# Patient Record
Sex: Female | Born: 1990 | Race: White | Hispanic: No | Marital: Married | Smoking: Never smoker
Health system: Southern US, Community
[De-identification: ages and names within clinical notes are randomized; demographics above are authoritative.]

## PROBLEM LIST (undated history)

## (undated) ENCOUNTER — Inpatient Hospital Stay (HOSPITAL_COMMUNITY): Payer: Self-pay

## (undated) ENCOUNTER — Inpatient Hospital Stay (HOSPITAL_COMMUNITY): Payer: PRIVATE HEALTH INSURANCE

## (undated) DIAGNOSIS — K219 Gastro-esophageal reflux disease without esophagitis: Secondary | ICD-10-CM

## (undated) DIAGNOSIS — D649 Anemia, unspecified: Secondary | ICD-10-CM

## (undated) DIAGNOSIS — G919 Hydrocephalus, unspecified: Secondary | ICD-10-CM

## (undated) DIAGNOSIS — O34219 Maternal care for unspecified type scar from previous cesarean delivery: Secondary | ICD-10-CM

## (undated) DIAGNOSIS — M419 Scoliosis, unspecified: Secondary | ICD-10-CM

## (undated) HISTORY — PX: COLONOSCOPY: SHX174

## (undated) HISTORY — PX: VENTRICULOPERITONEAL SHUNT: SHX204

---
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
## 2013-10-31 LAB — OB RESULTS CONSOLE RUBELLA ANTIBODY, IGM: Rubella: IMMUNE

## 2013-10-31 LAB — OB RESULTS CONSOLE HEPATITIS B SURFACE ANTIGEN: HEP B S AG: NEGATIVE

## 2013-10-31 LAB — OB RESULTS CONSOLE HIV ANTIBODY (ROUTINE TESTING): HIV: NONREACTIVE

## 2013-10-31 LAB — OB RESULTS CONSOLE ANTIBODY SCREEN: ANTIBODY SCREEN: NEGATIVE

## 2013-10-31 LAB — OB RESULTS CONSOLE RPR: RPR: NONREACTIVE

## 2013-10-31 LAB — OB RESULTS CONSOLE ABO/RH: RH Type: POSITIVE

## 2013-12-17 ENCOUNTER — Inpatient Hospital Stay (HOSPITAL_COMMUNITY)
Admission: AD | Admit: 2013-12-17 | Discharge: 2013-12-17 | Disposition: A | Discharge status: Home / Self Care | Payer: BC Managed Care – PPO | Source: Ambulatory Visit | Attending: Obstetrics & Gynecology | Admitting: Obstetrics & Gynecology

## 2013-12-17 ENCOUNTER — Inpatient Hospital Stay (HOSPITAL_COMMUNITY): Payer: BC Managed Care – PPO

## 2013-12-17 ENCOUNTER — Encounter (HOSPITAL_COMMUNITY): Payer: Self-pay

## 2013-12-17 DIAGNOSIS — O468X9 Other antepartum hemorrhage, unspecified trimester: Secondary | ICD-10-CM

## 2013-12-17 DIAGNOSIS — O418X90 Other specified disorders of amniotic fluid and membranes, unspecified trimester, not applicable or unspecified: Secondary | ICD-10-CM

## 2013-12-17 DIAGNOSIS — G911 Obstructive hydrocephalus: Secondary | ICD-10-CM | POA: Insufficient documentation

## 2013-12-17 DIAGNOSIS — O209 Hemorrhage in early pregnancy, unspecified: Secondary | ICD-10-CM | POA: Insufficient documentation

## 2013-12-17 DIAGNOSIS — K219 Gastro-esophageal reflux disease without esophagitis: Secondary | ICD-10-CM | POA: Insufficient documentation

## 2013-12-17 DIAGNOSIS — O43899 Other placental disorders, unspecified trimester: Principal | ICD-10-CM

## 2013-12-17 DIAGNOSIS — R109 Unspecified abdominal pain: Secondary | ICD-10-CM | POA: Insufficient documentation

## 2013-12-17 DIAGNOSIS — O368990 Maternal care for other specified fetal problems, unspecified trimester, not applicable or unspecified: Secondary | ICD-10-CM | POA: Insufficient documentation

## 2013-12-17 HISTORY — DX: Hydrocephalus, unspecified: G91.9

## 2013-12-17 HISTORY — DX: Scoliosis, unspecified: M41.9

## 2013-12-17 HISTORY — DX: Gastro-esophageal reflux disease without esophagitis: K21.9

## 2013-12-17 LAB — CBC
HCT: 34.8 % — ABNORMAL LOW (ref 36.0–46.0)
HEMOGLOBIN: 12.5 g/dL (ref 12.0–15.0)
MCH: 30 pg (ref 26.0–34.0)
MCHC: 35.9 g/dL (ref 30.0–36.0)
MCV: 83.5 fL (ref 78.0–100.0)
Platelets: 157 10*3/uL (ref 150–400)
RBC: 4.17 MIL/uL (ref 3.87–5.11)
RDW: 13.8 % (ref 11.5–15.5)
WBC: 7.2 10*3/uL (ref 4.0–10.5)

## 2013-12-17 NOTE — Discharge Instructions (Signed)
Subchorionic Hematoma °A subchorionic hematoma is a gathering of blood between the outer wall of the placenta and the inner wall of the womb (uterus). The placenta is the organ that connects the fetus to the wall of the uterus. The placenta performs the feeding, breathing (oxygen to the fetus), and waste removal (excretory work) of the fetus.  °Subchorionic hematoma is the most common abnormality found on a result from ultrasonography done during the first trimester or early second trimester of pregnancy. If there has been little or no vaginal bleeding, early small hematomas usually shrink on their own and do not affect your baby or pregnancy. The blood is gradually absorbed over 1 2 weeks. When bleeding starts later in pregnancy or the hematoma is larger or occurs in an older pregnant woman, the outcome may not be as good. Larger hematomas may get bigger, which increases the chances for miscarriage. Subchorionic hematoma also increases the risk of premature detachment of the placenta from the uterus, preterm (premature) labor, and stillbirth. °HOME CARE INSTRUCTIONS  °· Stay on bed rest if your health care provider recommends this. Although bed rest will not prevent more bleeding or prevent a miscarriage, your health care provider may recommend bed rest until you are advised otherwise. °· Avoid heavy lifting (more than 10 lb [4.5 kg]), exercise, sexual intercourse, or douching as directed by your health care provider. °· Keep track of the number of pads you use each day and how soaked (saturated) they are. Write down this information. °· Do not use tampons. °· Keep all follow-up appointments as directed by your health care provider. Your health care provider may ask you to have follow-up blood tests or ultrasound tests or both. °SEEK IMMEDIATE MEDICAL CARE IF:  °· You have severe cramps in your stomach, back, abdomen, or pelvis. °· You have a fever. °· You pass large clots or tissue. Save any tissue for your health  care provider to look at. °· Your bleeding increases or you become lightheaded, feel weak, or have fainting episodes. °Document Released: 01/28/2007 Document Revised: 08/03/2013 Document Reviewed: 05/12/2013 °ExitCare® Patient Information ©2014 [employer]. ° °Pelvic Rest °Pelvic rest is sometimes recommended for women when:  °· The placenta is partially or completely covering the opening of the cervix (placenta previa). °· There is bleeding between the uterine wall and the amniotic sac in the first trimester (subchorionic hemorrhage). °· The cervix begins to open without labor starting (incompetent cervix, cervical insufficiency). °· The labor is too early (preterm labor). °HOME CARE INSTRUCTIONS °· Do not have sexual intercourse, stimulation, or an orgasm. °· Do not use tampons, douche, or put anything in the vagina. °· Do not lift anything over 10 pounds (4.5 kg). °· Avoid strenuous activity or straining your pelvic muscles. °SEEK MEDICAL CARE IF:  °· You have any vaginal bleeding during pregnancy. Treat this as a potential emergency. °· You have cramping pain felt low in the stomach (stronger than menstrual cramps). °· You notice vaginal discharge (watery, mucus, or bloody). °· You have a low, dull backache. °· There are regular contractions or uterine tightening. °SEEK IMMEDIATE MEDICAL CARE IF: °You have vaginal bleeding and have placenta previa.  °Document Released: 02/07/2011 Document Revised: 01/05/2012 Document Reviewed: 02/07/2011 °ExitCare® Patient Information ©2014 [employer]. ° °

## 2013-12-17 NOTE — MAU Provider Note (Signed)
History     CSN: [id number]  Arrival date and time: 12/17/13 5366 Orders placed in EPIC: [phone] Provider at bedside: 0800     Chief Complaint  Patient presents with  . Vaginal Bleeding   HPI  [name] is a 23 yo  G1P0 female at 12.[redacted] wks gestation by ultrasound presenting with vaginal bleeding and  abdominal cramping. She has a known [facility] that measured 1 cm.  Past Medical History  Diagnosis Date  . Hydrocephalus with operating shunt     surgery 2001 and then revised 2002  . GERD (gastroesophageal reflux disease)   . Scoliosis     Past Surgical History  Procedure Laterality Date  . Ventriculoperitoneal shunt      Family History  Problem Relation Age of Onset  . Asthma Mother   . Hypertension Mother   . Hypertension Father     History  Substance Use Topics  . Smoking status: Never Smoker   . Smokeless tobacco: Never Used  . Alcohol Use: No    Allergies: No Known Allergies  Prescriptions prior to admission  Medication Sig Dispense Refill  . famotidine (PEPCID) 10 MG tablet Take 10 mg by mouth daily.      . Prenatal Vit-Fe Fumarate-FA (PRENATAL MULTIVITAMIN) TABS tablet Take 1 tablet by mouth daily at 12 noon.        Review of Systems  Constitutional: Negative.   HENT: Negative.   Eyes: Negative.   Respiratory: Negative.   Cardiovascular: Negative.   Gastrointestinal: Negative.   Genitourinary: Negative.   Musculoskeletal: Negative.   Skin: Negative.   Neurological: Negative.   Endo/Heme/Allergies: Negative.   Psychiatric/Behavioral: Negative.    Results for orders placed during the hospital encounter of 12/17/13 (from the past 24 hour(s))  CBC     Status: Abnormal   Collection Time    12/17/13  7:04 AM      Result Value Ref Range   WBC 7.2  4.0 - 10.5 K/uL   RBC 4.17  3.87 - 5.11 MIL/uL   Hemoglobin 12.5  12.0 - 15.0 g/dL   HCT 47.4 (*) 25.9 - 56.3 %   MCV 83.5  78.0 - 100.0 fL   MCH 30.0  26.0 - 34.0 pg   MCHC 35.9  30.0 - 36.0 g/dL    RDW 87.5  64.3 - 32.9 %   Platelets 157  150 - 400 K/uL   US Ob Comp Less 14 Wks  12/17/2013   CLINICAL DATA:  Vaginal bleeding, history of a known subchorionic hemorrhage  EXAM: OBSTETRIC <14 WK ULTRASOUND  TECHNIQUE: Transabdominal ultrasound was performed for evaluation of the gestation as well as the maternal uterus and adnexal regions.  COMPARISON:  None available  FINDINGS: Intrauterine gestational sac: Visualized/normal in shape.  Yolk sac:  Not visualized  Embryo:  Visualized  Cardiac Activity: Visualized  Heart Rate: 140 bpm  CRL:   6.04 cm 12 w 4 d                  [place] EDC: 06/27/2014  Maternal uterus/adnexae: Single viable intrauterine pregnancy. Estimated gestational age [redacted] weeks 4 days by crown-rump length measurement as above. Small anterior fundal subchorionic hemorrhage noted measures 3.2 x 1.0 x 4.0 cm. Ovaries appear normal. Small to moderate amount of pelvic free fluid within the cul-de-sac and adnexal regions.  IMPRESSION: Single viable intrauterine pregnancy, estimated gestational age [redacted] weeks 4 days.  Small anterior subchorionic hemorrhage  Small to moderate amount of pelvic free  fluid   Electronically Signed   By: Ruel Favorsrevor  Shick M.D.   On: 12/17/2013 07:56   Physical Exam   Blood pressure 116/66, pulse 69, temperature 97.9 F (36.6 C), temperature source Oral, resp. rate 18.  Physical Exam  Constitutional: She is oriented to person, place, and time. She appears well-developed and well-nourished.  HENT:  Head: Normocephalic and atraumatic.  Eyes: Pupils are equal, round, and reactive to light.  Neck: Normal range of motion. Neck supple.  Cardiovascular: Normal rate, regular rhythm and normal heart sounds.   Respiratory: Effort normal and breath sounds normal.  GI: Soft. Bowel sounds are normal.  Genitourinary: Vagina normal and uterus normal.  Gravid uterus, S=D / small amount of light brown blood in vaginal vault  Musculoskeletal: Normal range of motion.  Neurological:  She is alert and oriented to person, place, and time.  Skin: Skin is warm and dry.  Psychiatric: She has a normal mood and affect. Her behavior is normal. Judgment and thought content normal.    MAU Course  Procedures CBC OB Ultrasound <14wks  Assessment and Plan  IUP @ 12.[redacted] wks gestation Subchorionic Hematoma  Discharge Home Schedule ultrascreen and NT sono next week Pelvic Rest Bleeding Precautions Call the office, if symptoms worsen  Kenard GowerDAWSON, Kristina Aguilar, M, MSN, CNM 12/17/2013, 8:11 AM

## 2013-12-17 NOTE — MAU Note (Signed)
Woke up having vaginal bleeding at 6 am, dk red across panties but now having no bleeding. C/o "gas pains" in low abd when she woke up too.

## 2014-04-04 LAB — OB RESULTS CONSOLE RPR: RPR: NONREACTIVE

## 2014-05-26 IMAGING — US US OB COMP LESS 14 WK
1 series · 14 of 28 positions shown · non-contrast
Comparison: None available

CLINICAL DATA: Vaginal bleeding, history of a known subchorionic
hemorrhage

EXAM:
OBSTETRIC <14 WK ULTRASOUND
TECHNIQUE: Transabdominal ultrasound was performed for evaluation of the
gestation as well as the maternal uterus and adnexal regions.

[Series 1: us ob comp less 14 wks · 14 of 34 slices shown]
[im 2/34]
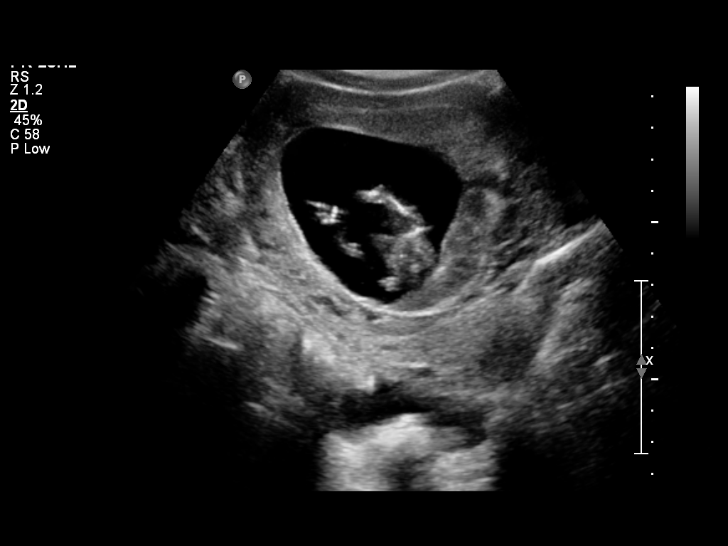
[im 4/34]
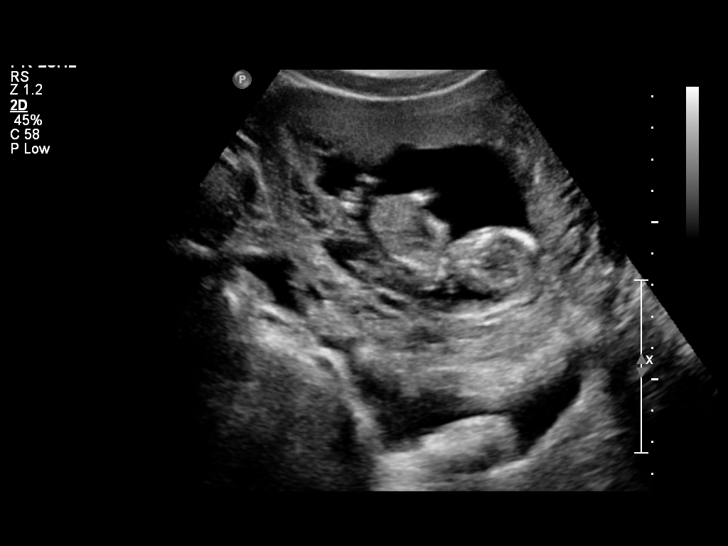
[im 7/34]
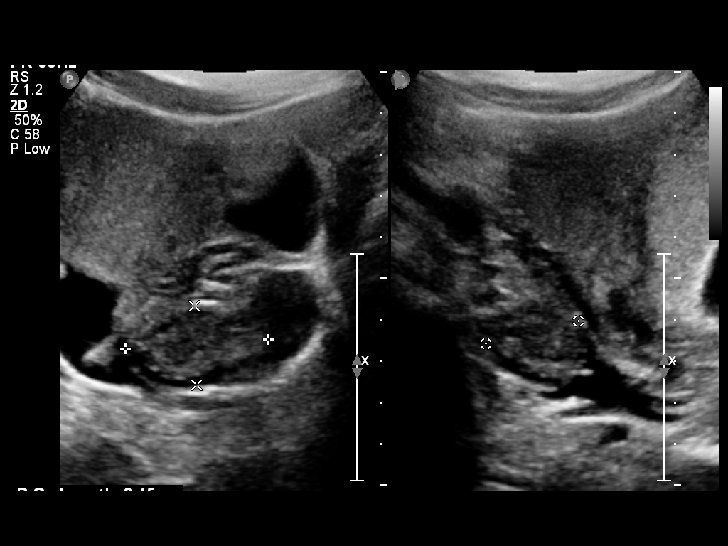
[im 9/34]
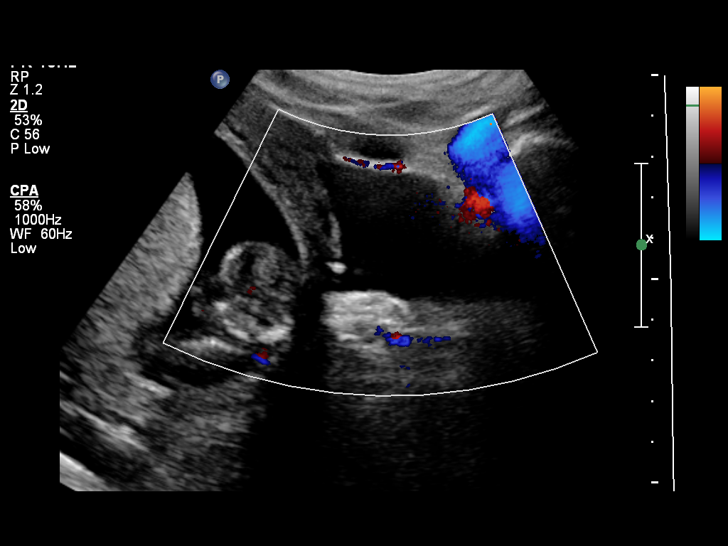
[im 12/34]
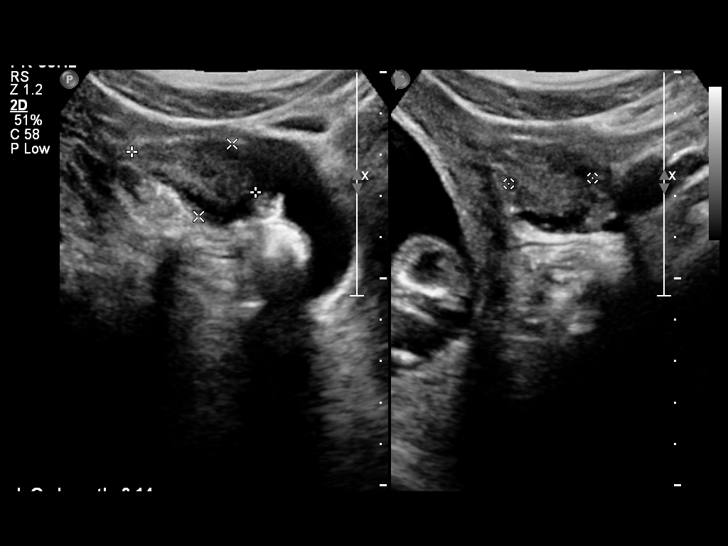
[im 14/34]
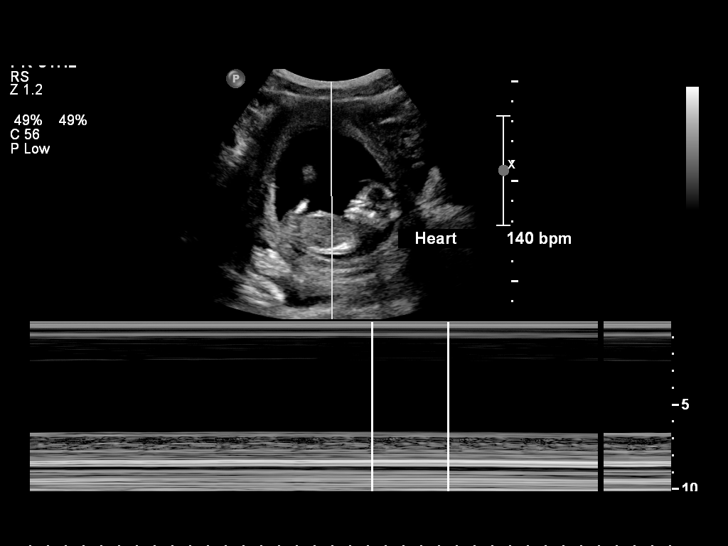
[im 16/34]
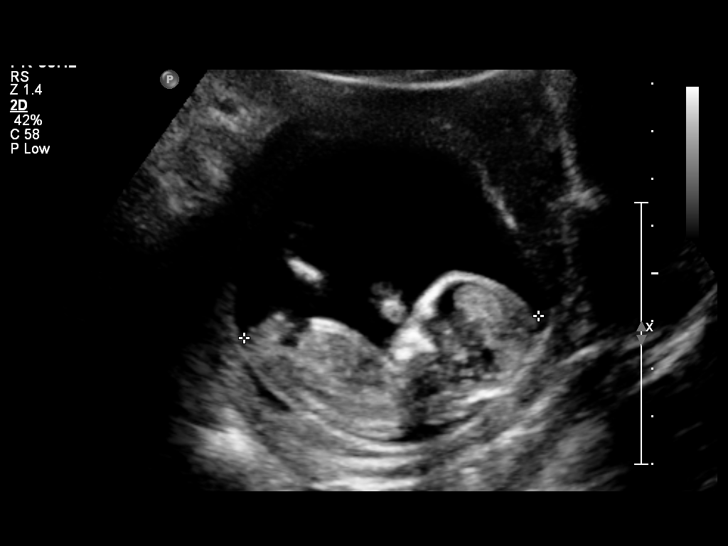
[im 19/34]
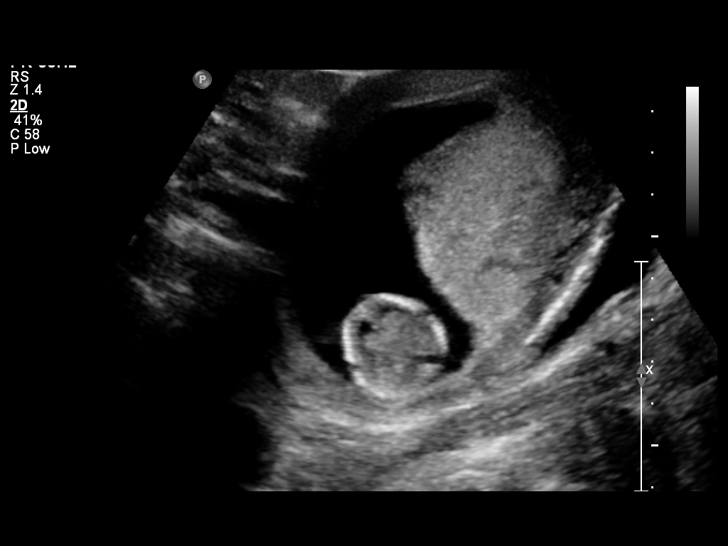
[im 21/34]
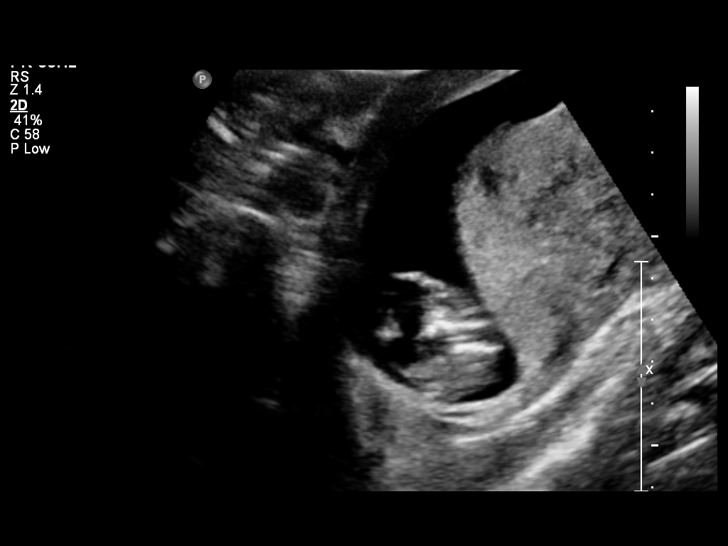
[im 24/34]
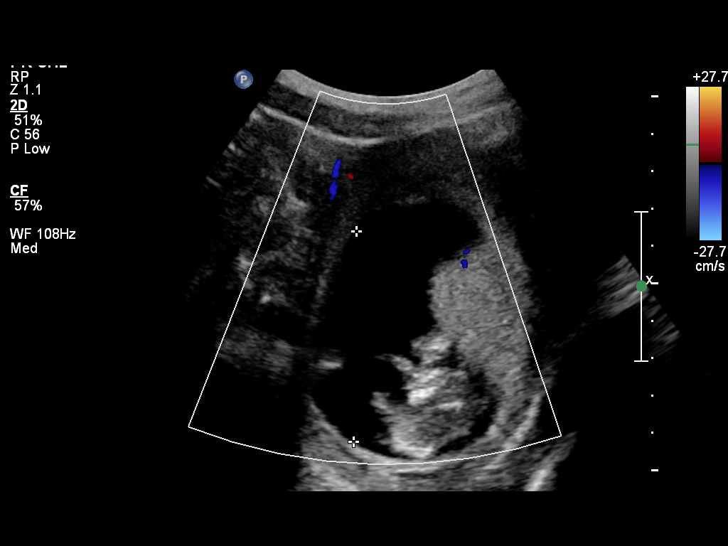
[im 26/34]
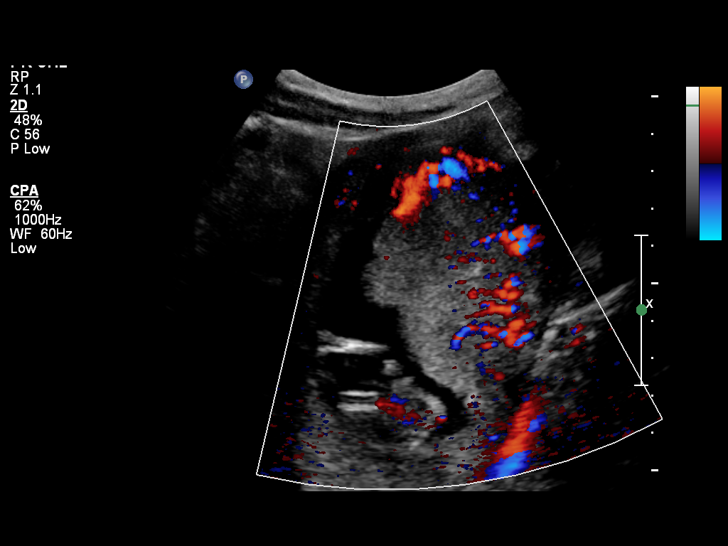
[im 29/34]
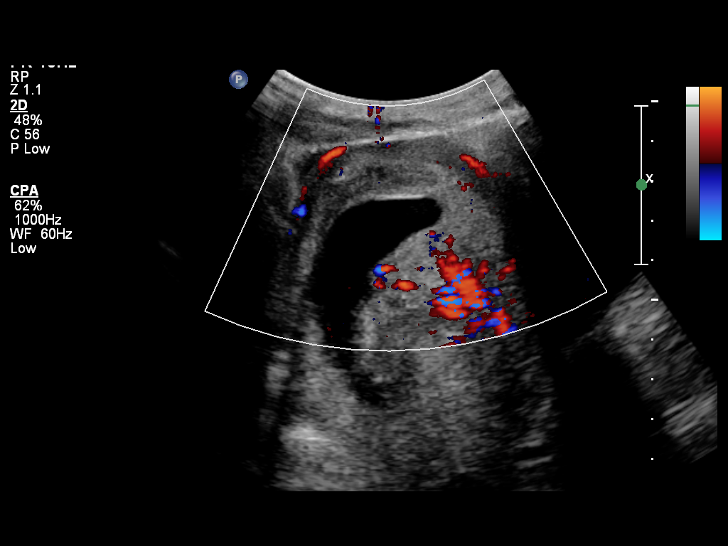
[im 31/34]
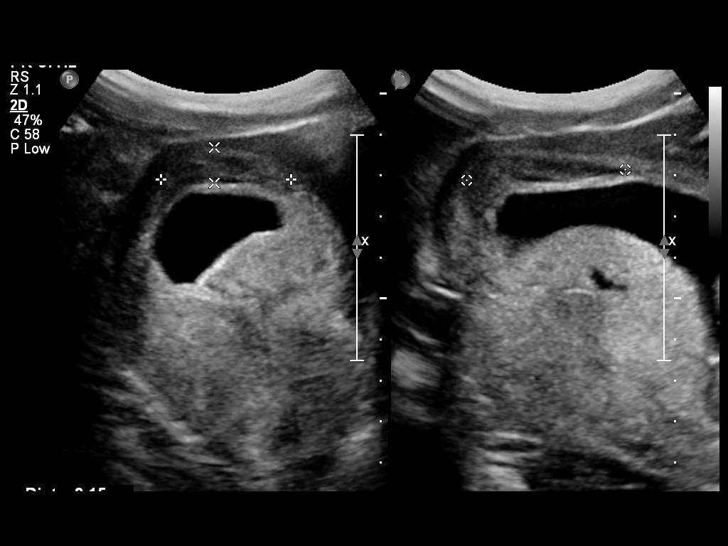
[im 34/34]
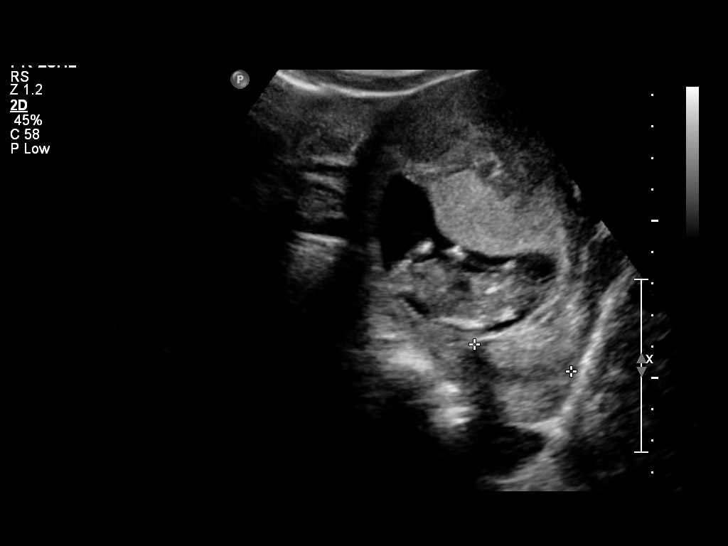

[14 of 28 positions shown; findings below may reference images not displayed]

FINDINGS: Intrauterine gestational sac: Visualized/normal in shape.

Yolk sac:  Not visualized

Embryo:  Visualized

Cardiac Activity: Visualized

Heart Rate: 140 bpm

CRL:   6.04 cm 12 w 4 d                  [place] EDC: 06/27/2014

Maternal uterus/adnexae: Single viable intrauterine pregnancy.
Estimated gestational age 12 weeks 4 days by crown-rump length
measurement as above. Small anterior fundal subchorionic hemorrhage
noted measures 3.2 x 1.0 x 4.0 cm. Ovaries appear normal. Small to
moderate amount of pelvic free fluid within the cul-de-sac and
adnexal regions.
IMPRESSION: Single viable intrauterine pregnancy, estimated gestational age 12
weeks 4 days.

Small anterior subchorionic hemorrhage

Small to moderate amount of pelvic free fluid

## 2014-06-19 ENCOUNTER — Other Ambulatory Visit: Payer: Self-pay | Admitting: Obstetrics & Gynecology

## 2014-06-20 ENCOUNTER — Encounter (HOSPITAL_COMMUNITY): Payer: Self-pay | Admitting: Pharmacist

## 2014-06-22 ENCOUNTER — Encounter (HOSPITAL_COMMUNITY)
Admission: RE | Admit: 2014-06-22 | Discharge: 2014-06-22 | Disposition: A | Discharge status: Home / Self Care | Payer: BC Managed Care – PPO | Source: Ambulatory Visit | Attending: Obstetrics & Gynecology | Admitting: Obstetrics & Gynecology

## 2014-06-22 ENCOUNTER — Encounter (HOSPITAL_COMMUNITY): Payer: Self-pay

## 2014-06-22 HISTORY — DX: Anemia, unspecified: D64.9

## 2014-06-22 LAB — CBC
HEMATOCRIT: 37.5 % (ref 36.0–46.0)
Hemoglobin: 13.2 g/dL (ref 12.0–15.0)
MCH: 32.4 pg (ref 26.0–34.0)
MCHC: 35.2 g/dL (ref 30.0–36.0)
MCV: 92.1 fL (ref 78.0–100.0)
Platelets: 162 10*3/uL (ref 150–400)
RBC: 4.07 MIL/uL (ref 3.87–5.11)
RDW: 13.9 % (ref 11.5–15.5)
WBC: 10.8 10*3/uL — ABNORMAL HIGH (ref 4.0–10.5)

## 2014-06-22 LAB — TYPE AND SCREEN
ABO/RH(D): O POS
ANTIBODY SCREEN: NEGATIVE

## 2014-06-22 LAB — ABO/RH: ABO/RH(D): O POS

## 2014-06-22 NOTE — Patient Instructions (Addendum)
   Your procedure is scheduled on:  Friday, August 28  Enter through the [employer] of [facility] at: 1215 PM Pick up the phone at the desk and dial [phone] and inform us of your arrival.  Please call this number if you have any problems the morning of surgery: [phone]  Remember: Do not eat food after midnight: Thursday Do not drink clear liquids after:  930 AM Friday, day of surgery Take these medicines the morning of surgery with a SIP OF WATER:  Do not wear jewelry, make-up, or FINGER nail polish No metal in your hair or on your body. Do not wear lotions, powders, perfumes.  You may wear deodorant.  Do not bring valuables to the hospital. Contacts, dentures or bridgework may not be worn into surgery.  Leave suitcase in the car. After Surgery it may be brought to your room. For patients being admitted to the hospital, checkout time is 11:00am the day of discharge.  Home with husband Trinna Post cell [phone].

## 2014-06-23 ENCOUNTER — Inpatient Hospital Stay (HOSPITAL_COMMUNITY): Payer: BC Managed Care – PPO | Admitting: Anesthesiology

## 2014-06-23 ENCOUNTER — Inpatient Hospital Stay (HOSPITAL_COMMUNITY)
Admission: AD | Admit: 2014-06-23 | Discharge: 2014-06-25 | DRG: 766 | Disposition: A | Discharge status: Home / Self Care | Payer: BC Managed Care – PPO | Source: Ambulatory Visit | Attending: Obstetrics & Gynecology | Admitting: Obstetrics & Gynecology

## 2014-06-23 ENCOUNTER — Encounter (HOSPITAL_COMMUNITY): Payer: BC Managed Care – PPO | Admitting: Anesthesiology

## 2014-06-23 ENCOUNTER — Encounter (HOSPITAL_COMMUNITY): Payer: Self-pay | Admitting: Certified Registered Nurse Anesthetist

## 2014-06-23 ENCOUNTER — Encounter (HOSPITAL_COMMUNITY)
Admission: AD | Disposition: A | Discharge status: Home / Self Care | Payer: Self-pay | Source: Ambulatory Visit | Attending: Obstetrics & Gynecology

## 2014-06-23 DIAGNOSIS — K219 Gastro-esophageal reflux disease without esophagitis: Secondary | ICD-10-CM | POA: Diagnosis present

## 2014-06-23 DIAGNOSIS — Z825 Family history of asthma and other chronic lower respiratory diseases: Secondary | ICD-10-CM

## 2014-06-23 DIAGNOSIS — Z8249 Family history of ischemic heart disease and other diseases of the circulatory system: Secondary | ICD-10-CM

## 2014-06-23 DIAGNOSIS — Z982 Presence of cerebrospinal fluid drainage device: Secondary | ICD-10-CM | POA: Diagnosis not present

## 2014-06-23 DIAGNOSIS — M4120 Other idiopathic scoliosis, site unspecified: Secondary | ICD-10-CM | POA: Diagnosis present

## 2014-06-23 DIAGNOSIS — O321XX0 Maternal care for breech presentation, not applicable or unspecified: Principal | ICD-10-CM | POA: Diagnosis present

## 2014-06-23 LAB — RPR

## 2014-06-23 SURGERY — Surgical Case
Anesthesia: Epidural | Site: Abdomen

## 2014-06-23 MED ORDER — SENNOSIDES-DOCUSATE SODIUM 8.6-50 MG PO TABS
2.0000 | ORAL_TABLET | ORAL | Status: DC
  Administered 2014-06-23 – 2014-06-25 (×2): 2 via ORAL
  Filled 2014-06-23 (×2): qty 2

## 2014-06-23 MED ORDER — OXYTOCIN 10 UNIT/ML IJ SOLN
INTRAMUSCULAR | Status: AC
  Filled 2014-06-23: qty 4

## 2014-06-23 MED ORDER — ONDANSETRON HCL 4 MG/2ML IJ SOLN
INTRAMUSCULAR | Status: AC
  Filled 2014-06-23: qty 2

## 2014-06-23 MED ORDER — PHENYLEPHRINE 8 MG IN D5W 100 ML (0.08MG/ML) PREMIX OPTIME
INJECTION | INTRAVENOUS | Status: AC
  Filled 2014-06-23: qty 100

## 2014-06-23 MED ORDER — MORPHINE SULFATE 0.5 MG/ML IJ SOLN
INTRAMUSCULAR | Status: AC
  Filled 2014-06-23: qty 10

## 2014-06-23 MED ORDER — PRENATAL MULTIVITAMIN CH
1.0000 | ORAL_TABLET | Freq: Every day | ORAL | Status: DC
  Administered 2014-06-24 – 2014-06-25 (×2): 1 via ORAL
  Filled 2014-06-23 (×2): qty 1

## 2014-06-23 MED ORDER — HYDROMORPHONE HCL PF 1 MG/ML IJ SOLN: 0.2500 mg | INTRAMUSCULAR | Status: DC | PRN

## 2014-06-23 MED ORDER — LANOLIN HYDROUS EX OINT
1.0000 | TOPICAL_OINTMENT | CUTANEOUS | Status: DC | PRN
Start: 2014-06-23 — End: 2014-06-25

## 2014-06-23 MED ORDER — OXYCODONE-ACETAMINOPHEN 5-325 MG PO TABS: 1.0000 | ORAL_TABLET | ORAL | Status: DC | PRN

## 2014-06-23 MED ORDER — METOCLOPRAMIDE HCL 5 MG/ML IJ SOLN: 10.0000 mg | Freq: Three times a day (TID) | INTRAMUSCULAR | Status: DC | PRN

## 2014-06-23 MED ORDER — LACTATED RINGERS IV SOLN
40.0000 [IU] | INTRAVENOUS | Status: DC | PRN
  Administered 2014-06-23: 40 [IU] via INTRAVENOUS

## 2014-06-23 MED ORDER — CEFAZOLIN SODIUM-DEXTROSE 2-3 GM-% IV SOLR
INTRAVENOUS | Status: AC
  Filled 2014-06-23: qty 50

## 2014-06-23 MED ORDER — KETOROLAC TROMETHAMINE 30 MG/ML IJ SOLN: 30.0000 mg | Freq: Four times a day (QID) | INTRAMUSCULAR | Status: DC | PRN

## 2014-06-23 MED ORDER — ONDANSETRON HCL 4 MG/2ML IJ SOLN
INTRAMUSCULAR | Status: DC | PRN
  Administered 2014-06-23: 4 mg via INTRAVENOUS

## 2014-06-23 MED ORDER — FENTANYL CITRATE 0.05 MG/ML IJ SOLN
INTRAMUSCULAR | Status: DC | PRN
  Administered 2014-06-23: 25 ug via INTRATHECAL

## 2014-06-23 MED ORDER — SIMETHICONE 80 MG PO CHEW
80.0000 mg | CHEWABLE_TABLET | ORAL | Status: DC
  Filled 2014-06-23: qty 1

## 2014-06-23 MED ORDER — PHENYLEPHRINE 8 MG IN D5W 100 ML (0.08MG/ML) PREMIX OPTIME
INJECTION | INTRAVENOUS | Status: AC
Start: 2014-06-23 — End: 2014-06-23
  Filled 2014-06-23: qty 100

## 2014-06-23 MED ORDER — WITCH HAZEL-GLYCERIN EX PADS
1.0000 | MEDICATED_PAD | CUTANEOUS | Status: DC | PRN
Start: 2014-06-23 — End: 2014-06-25

## 2014-06-23 MED ORDER — MORPHINE SULFATE (PF) 0.5 MG/ML IJ SOLN
INTRAMUSCULAR | Status: DC | PRN
  Administered 2014-06-23: .1 mg via INTRATHECAL

## 2014-06-23 MED ORDER — LACTATED RINGERS IV SOLN
INTRAVENOUS | Status: DC
  Administered 2014-06-23 (×2): via INTRAVENOUS

## 2014-06-23 MED ORDER — IBUPROFEN 600 MG PO TABS
600.0000 mg | ORAL_TABLET | Freq: Four times a day (QID) | ORAL | Status: DC
  Administered 2014-06-23 – 2014-06-25 (×7): 600 mg via ORAL
  Filled 2014-06-23 (×7): qty 1

## 2014-06-23 MED ORDER — DIPHENHYDRAMINE HCL 50 MG/ML IJ SOLN: 12.5000 mg | INTRAMUSCULAR | Status: DC | PRN

## 2014-06-23 MED ORDER — ONDANSETRON HCL 4 MG/2ML IJ SOLN
INTRAMUSCULAR | Status: AC
Start: 2014-06-23 — End: 2014-06-23
  Filled 2014-06-23: qty 2

## 2014-06-23 MED ORDER — ONDANSETRON HCL 4 MG/2ML IJ SOLN: 4.0000 mg | INTRAMUSCULAR | Status: DC | PRN

## 2014-06-23 MED ORDER — PHENYLEPHRINE 8 MG IN D5W 100 ML (0.08MG/ML) PREMIX OPTIME
INJECTION | INTRAVENOUS | Status: DC | PRN
  Administered 2014-06-23: 60 ug/min via INTRAVENOUS

## 2014-06-23 MED ORDER — ZOLPIDEM TARTRATE 5 MG PO TABS: 5.0000 mg | ORAL_TABLET | Freq: Every evening | ORAL | Status: DC | PRN

## 2014-06-23 MED ORDER — MENTHOL 3 MG MT LOZG: 1.0000 | LOZENGE | OROMUCOSAL | Status: DC | PRN

## 2014-06-23 MED ORDER — DIPHENHYDRAMINE HCL 50 MG/ML IJ SOLN: 25.0000 mg | INTRAMUSCULAR | Status: DC | PRN

## 2014-06-23 MED ORDER — NALBUPHINE HCL 10 MG/ML IJ SOLN: 5.0000 mg | INTRAMUSCULAR | Status: DC | PRN

## 2014-06-23 MED ORDER — NALOXONE HCL 0.4 MG/ML IJ SOLN
0.4000 mg | INTRAMUSCULAR | Status: DC | PRN
Start: 2014-06-23 — End: 2014-06-24

## 2014-06-23 MED ORDER — SODIUM CHLORIDE 0.9 % IJ SOLN: 3.0000 mL | INTRAMUSCULAR | Status: DC | PRN

## 2014-06-23 MED ORDER — SCOPOLAMINE 1 MG/3DAYS TD PT72: 1.0000 | MEDICATED_PATCH | Freq: Once | TRANSDERMAL | Status: DC

## 2014-06-23 MED ORDER — SIMETHICONE 80 MG PO CHEW
80.0000 mg | CHEWABLE_TABLET | ORAL | Status: DC | PRN
  Administered 2014-06-23: 80 mg via ORAL
  Filled 2014-06-23: qty 1

## 2014-06-23 MED ORDER — NALOXONE HCL 1 MG/ML IJ SOLN
1.0000 ug/kg/h | INTRAVENOUS | Status: DC | PRN
  Filled 2014-06-23: qty 2

## 2014-06-23 MED ORDER — CEFAZOLIN SODIUM-DEXTROSE 2-3 GM-% IV SOLR
2.0000 g | INTRAVENOUS | Status: AC
  Administered 2014-06-23: 2 g via INTRAVENOUS

## 2014-06-23 MED ORDER — NALBUPHINE HCL 10 MG/ML IJ SOLN
5.0000 mg | INTRAMUSCULAR | Status: DC | PRN
  Administered 2014-06-24: 10 mg via INTRAVENOUS
  Filled 2014-06-23: qty 1

## 2014-06-23 MED ORDER — SCOPOLAMINE 1 MG/3DAYS TD PT72
MEDICATED_PATCH | TRANSDERMAL | Status: AC
  Filled 2014-06-23: qty 1

## 2014-06-23 MED ORDER — DIPHENHYDRAMINE HCL 25 MG PO CAPS: 25.0000 mg | ORAL_CAPSULE | Freq: Four times a day (QID) | ORAL | Status: DC | PRN

## 2014-06-23 MED ORDER — ONDANSETRON HCL 4 MG PO TABS: 4.0000 mg | ORAL_TABLET | ORAL | Status: DC | PRN

## 2014-06-23 MED ORDER — FENTANYL CITRATE 0.05 MG/ML IJ SOLN
INTRAMUSCULAR | Status: AC
  Filled 2014-06-23: qty 2

## 2014-06-23 MED ORDER — SIMETHICONE 80 MG PO CHEW
80.0000 mg | CHEWABLE_TABLET | Freq: Three times a day (TID) | ORAL | Status: DC
  Administered 2014-06-24 – 2014-06-25 (×5): 80 mg via ORAL
  Filled 2014-06-23 (×5): qty 1

## 2014-06-23 MED ORDER — TETANUS-DIPHTH-ACELL PERTUSSIS 5-2.5-18.5 LF-MCG/0.5 IM SUSP: 0.5000 mL | Freq: Once | INTRAMUSCULAR | Status: DC

## 2014-06-23 MED ORDER — ONDANSETRON HCL 4 MG/2ML IJ SOLN: 4.0000 mg | Freq: Three times a day (TID) | INTRAMUSCULAR | Status: DC | PRN

## 2014-06-23 MED ORDER — OXYTOCIN 40 UNITS IN LACTATED RINGERS INFUSION - SIMPLE MED: 62.5000 mL/h | INTRAVENOUS | Status: AC

## 2014-06-23 MED ORDER — FAMOTIDINE 20 MG PO TABS
10.0000 mg | ORAL_TABLET | Freq: Two times a day (BID) | ORAL | Status: DC
  Administered 2014-06-24: 10:00:00 via ORAL
  Administered 2014-06-25 (×2): 10 mg via ORAL
  Filled 2014-06-23 (×3): qty 1

## 2014-06-23 MED ORDER — LACTATED RINGERS IV SOLN
INTRAVENOUS | Status: DC
  Administered 2014-06-23: via INTRAVENOUS

## 2014-06-23 MED ORDER — 0.9 % SODIUM CHLORIDE (POUR BTL) OPTIME
TOPICAL | Status: DC | PRN
  Administered 2014-06-23: 1000 mL

## 2014-06-23 MED ORDER — DIPHENHYDRAMINE HCL 25 MG PO CAPS
25.0000 mg | ORAL_CAPSULE | ORAL | Status: DC | PRN
Start: 2014-06-23 — End: 2014-06-24

## 2014-06-23 MED ORDER — DIBUCAINE 1 % RE OINT: 1.0000 "application " | TOPICAL_OINTMENT | RECTAL | Status: DC | PRN

## 2014-06-23 MED ORDER — MEPERIDINE HCL 25 MG/ML IJ SOLN: 6.2500 mg | INTRAMUSCULAR | Status: DC | PRN

## 2014-06-23 MED ORDER — LACTATED RINGERS IV SOLN
INTRAVENOUS | Status: DC | PRN
  Administered 2014-06-23: 15:00:00 via INTRAVENOUS

## 2014-06-23 MED ORDER — SCOPOLAMINE 1 MG/3DAYS TD PT72
1.0000 | MEDICATED_PATCH | Freq: Once | TRANSDERMAL | Status: DC
  Filled 2014-06-23: qty 1

## 2014-06-23 SURGICAL SUPPLY — 39 items
BENZOIN TINCTURE PRP APPL 2/3 (GAUZE/BANDAGES/DRESSINGS) ×3 IMPLANT
BLADE SURG 10 STRL SS (BLADE) ×6 IMPLANT
CLAMP CORD UMBIL (MISCELLANEOUS) IMPLANT
CLOSURE WOUND 1/2 X4 (GAUZE/BANDAGES/DRESSINGS) ×2
CLOTH BEACON ORANGE TIMEOUT ST (SAFETY) ×3 IMPLANT
CONTAINER PREFILL 10% NBF 15ML (MISCELLANEOUS) IMPLANT
DRAPE LG THREE QUARTER DISP (DRAPES) IMPLANT
DRSG OPSITE POSTOP 4X10 (GAUZE/BANDAGES/DRESSINGS) ×3 IMPLANT
DURAPREP 26ML APPLICATOR (WOUND CARE) ×3 IMPLANT
ELECT REM PT RETURN 9FT ADLT (ELECTROSURGICAL) ×3
ELECTRODE REM PT RTRN 9FT ADLT (ELECTROSURGICAL) ×1 IMPLANT
EXTRACTOR VACUUM KIWI (MISCELLANEOUS) IMPLANT
EXTRACTOR VACUUM M CUP 4 TUBE (SUCTIONS) IMPLANT
EXTRACTOR VACUUM M CUP 4' TUBE (SUCTIONS)
GLOVE BIO SURGEON STRL SZ7 (GLOVE) ×6 IMPLANT
GLOVE BIOGEL PI IND STRL 7.0 (GLOVE) ×2 IMPLANT
GLOVE BIOGEL PI INDICATOR 7.0 (GLOVE) ×4
GOWN STRL REUS W/TWL LRG LVL3 (GOWN DISPOSABLE) ×12 IMPLANT
KIT ABG SYR 3ML LUER SLIP (SYRINGE) IMPLANT
NEEDLE HYPO 25X5/8 SAFETYGLIDE (NEEDLE) IMPLANT
NS IRRIG 1000ML POUR BTL (IV SOLUTION) ×3 IMPLANT
PACK C SECTION WH (CUSTOM PROCEDURE TRAY) ×3 IMPLANT
PAD OB MATERNITY 4.3X12.25 (PERSONAL CARE ITEMS) ×3 IMPLANT
RTRCTR C-SECT PINK 25CM LRG (MISCELLANEOUS) IMPLANT
STAPLER VISISTAT 35W (STAPLE) IMPLANT
STRIP CLOSURE SKIN 1/2X4 (GAUZE/BANDAGES/DRESSINGS) ×4 IMPLANT
SUT MON AB-0 CT1 36 (SUTURE) ×9 IMPLANT
SUT PLAIN 0 NONE (SUTURE) IMPLANT
SUT PLAIN 2 0 (SUTURE)
SUT PLAIN ABS 2-0 CT1 27XMFL (SUTURE) IMPLANT
SUT VIC AB 0 CT1 27 (SUTURE) ×4
SUT VIC AB 0 CT1 27XBRD ANBCTR (SUTURE) ×2 IMPLANT
SUT VIC AB 2-0 CT1 27 (SUTURE) ×4
SUT VIC AB 2-0 CT1 TAPERPNT 27 (SUTURE) ×2 IMPLANT
SUT VIC AB 4-0 KS 27 (SUTURE) ×3 IMPLANT
SUT VICRYL 0 TIES 12 18 (SUTURE) IMPLANT
TOWEL OR 17X24 6PK STRL BLUE (TOWEL DISPOSABLE) ×3 IMPLANT
TRAY FOLEY CATH 14FR (SET/KITS/TRAYS/PACK) IMPLANT
WATER STERILE IRR 1000ML POUR (IV SOLUTION) ×3 IMPLANT

## 2014-06-23 NOTE — Consult Note (Signed)
Neonatology Note:   Attendance at C-section:    I was asked by Dr. Juliene Pina to attend this primary C/S at term due to breech presentation. The mother is a G1P0 O pos, GBS neg with an uncomplicated pregnancy. ROM at delivery, fluid clear. The cord was clamped and cut before the head was delivered; extraction of the head was difficult. Infant was floppy, pale, and apneic, with HR about 40. We bulb suctioned for clear secretions, then applied PPV. Color improved quickly, HR rose, and the baby began to cry at about 2 min of life. Tone normal by 3 minutes. Ap 2/9. Lungs clear to ausc in DR. To CN to care of Pediatrician.   Doretha Sou, MD

## 2014-06-23 NOTE — H&P (Addendum)
Kristina Aguilar is a 23 y.o. female presenting for cesarean section for breech presentation at 39.3 wks. Patient declined version.  She has med Hx sig for Hydrocephalus and VP shunt that is doing well. Scoliosis in thoracic spine. GERD (pepcid). 1st trimester bleeding but otherwise uncomplicated pregnancy. PNCare with CNMs at [employer].   History OB History   Grav Para Term Preterm Abortions TAB SAB Ect Mult Living   1              Past Medical History  Diagnosis Date  . Hydrocephalus with operating shunt     surgery 2001 and then revised 2002, Neurologist Dr Kristina Aguilar at [place]  . GERD (gastroesophageal reflux disease)   . Scoliosis   . Anemia     hx 2011   Past Surgical History  Procedure Laterality Date  . Ventriculoperitoneal shunt      09/2000 and revised in 03/2001  . Wisdom tooth extraction  2007  . Colonoscopy     Family History: family history includes Asthma in her mother; Hypertension in her father and mother. Social History:  reports that she has never smoked. She has never used smokeless tobacco. She reports that she does not drink alcohol or use illicit drugs.   Prenatal Transfer Tool  Maternal Diabetes: No Genetic Screening: Declined  Maternal Ultrasounds/Referrals: Normal Fetal Ultrasounds or other Referrals:  None Maternal Substance Abuse:  No Significant Maternal Medications:  Pepcid Significant Maternal Lab Results:  Lab values include: Group B Strep negative Other Comments:  Mother has hydrocephalus and a VP shunt, stable.   ROS   Neg      Blood pressure 117/69, pulse 73, temperature 98.4 F (36.9 C), temperature source Oral, resp. rate 16, SpO2 100.00%. Exam Physical Exam A&O x 3, no acute distress. Pleasant HEENT neg, no thyromegaly Lungs CTA bilat CV RRR, A1S2 normal Abdo soft, non tender, non acute. Bedside sono- Breech confirmed. Extr no edema/ tenderness Pelvic deferred FHT  Normal  Toco none   Prenatal labs: ABO, Rh: --/--/O POS, O  POS (08/27 1605) Antibody: NEG (08/27 1605) Rubella: Immune (01/05 0000) RPR: NON REAC (08/27 1605)  HBsAg: Negative (01/05 0000)  HIV: Non-reactive (01/05 0000)  GBS: negative Glucola normal Anatomy sono normal  Assessment/Plan: 23 yo female at 39.3 wks with breech presentation and desires cesarean delivery.   Risks/complications of surgery reviewed incl infection, bleeding, damage to internal organs including bladder, bowels, ureters, blood vessels, other risks from anesthesia, VTE and delayed complications of any surgery, complications in future surgery reviewed. Also discussed neonatal complications incl difficult delivery, laceration, vacuum assistance, TTN etc. Pt understands and agrees, all concerns addressed.     Kristina Aguilar R 06/23/2014, 1:09 PM

## 2014-06-23 NOTE — Transfer of Care (Signed)
Immediate Anesthesia Transfer of Care Note  Patient: Kristina Aguilar  Procedure(s) Performed: Procedure(s) with comments: Primary CESAREAN SECTION (N/A) - EDD: 06/27/14  Patient Location: PACU  Anesthesia Type:Spinal  Level of Consciousness: awake, alert  and oriented  Airway & Oxygen Therapy: Patient Spontanous Breathing  Post-op Assessment: Report given to PACU RN and Post -op Vital signs reviewed and stable  Post vital signs: Reviewed and stable  Complications: No apparent anesthesia complications

## 2014-06-23 NOTE — Anesthesia Preprocedure Evaluation (Signed)
Anesthesia Evaluation  Patient identified by MRN, date of birth, ID band Patient awake    Reviewed: Allergy & Precautions, H&P , NPO status , Patient's Chart, lab work & pertinent test results, reviewed documented beta blocker date and time   History of Anesthesia Complications Negative for: history of anesthetic complications  Airway Mallampati: II TM Distance: >3 FB Neck ROM: full    Dental  (+) Teeth Intact   Pulmonary neg pulmonary ROS,  breath sounds clear to auscultation        Cardiovascular negative cardio ROS  Rhythm:regular Rate:Normal     Neuro/Psych Has a VP shunt for hydrocephalus, placed in 2001, revised in 2002 negative psych ROS   GI/Hepatic Neg liver ROS, GERD-  Medicated,  Endo/Other  negative endocrine ROS  Renal/GU negative Renal ROS  negative genitourinary   Musculoskeletal   Abdominal   Peds  Hematology negative hematology ROS (+)   Anesthesia Other Findings   Reproductive/Obstetrics (+) Pregnancy (breech, primary C/S)                           Anesthesia Physical Anesthesia Plan  ASA: II  Anesthesia Plan: Epidural and Spinal   Post-op Pain Management:    Induction:   Airway Management Planned:   Additional Equipment:   Intra-op Plan:   Post-operative Plan:   Informed Consent: I have reviewed the patients History and Physical, chart, labs and discussed the procedure including the risks, benefits and alternatives for the proposed anesthesia with the patient or authorized representative who has indicated his/her understanding and acceptance.     Plan Discussed with: Surgeon and CRNA  Anesthesia Plan Comments:         Anesthesia Quick Evaluation

## 2014-06-23 NOTE — Progress Notes (Signed)
Honeycomb dressing changed due to continued serosang. drainage. Noted oozing from right far edge of incision. Direct pressure with sterile 4x4 with good results.  Honeycomb reapplied and tolerated well by patient. Instruction given for care and  how to remove,  after to discharge home.

## 2014-06-23 NOTE — Plan of Care (Signed)
Problem: Phase I Progression Outcomes Goal: Other Phase I Outcomes/Goals Outcome: Completed/Met Date Met:  06/23/14 Patient and spouse have birth plan, prefers to avoid pain medications, unless asked for, but agrees to take scheduled Motrin.

## 2014-06-23 NOTE — Op Note (Addendum)
Cesarean Section Procedure Note  Kristina Aguilar  06/23/2014  Procedure: Primary Low Transverse Cesarean section  Indications: Breech Presentation declined external version  Pre-operative Diagnosis: Breech, 39.3 wks.   Post-operative Diagnosis: Same   Surgeon:  Robley Fries, MD - Primary   Assistants: Raelyn Mora, CNM  Anesthesia: Spinal   Procedure Details:  The patient was seen in the Holding Room. 39.3 wks pregnancy with persistent Breech and patient declined external version. She has a VP-shunt for hydrocephalus but is stable with normal exams.  The risks, benefits, complications, treatment options, and expected outcomes were discussed with the patient. The patient concurred with the proposed plan, giving informed consent. identified as Kristina Aguilar and the procedure verified as C-Section Delivery. She was brought to the Operating Room and a Time Out was held and the above information confirmed. 2 gm Ancef given.  After induction of Spinal anesthesia, the patient was draped and prepped in the usual sterile manner. A Pfannenstiel Incision as made and carried down through the subcutaneous tissue to the fascia. Fascial incision was made and extended transversely. The fascia was separated from the underlying rectus tissue superiorly and inferiorly. The peritoneum was identified and entered. Peritoneal incision was extended longitudinally. The utero-vesical peritoneal reflection was incised transversely and the bladder flap was bluntly freed from the lower uterine segment. A low transverse uterine incision was made. Delivered from Complete Breech position was a female infant at 14.39 hours. First the right foot was grasped and leg was delivered out of the incision followed by left foot that was deeper in the lower segment. After both the legs were delivered, baby was gently pulled out until scapulas were seen, keeping his back anterior and then both the arms were delivered. Tight nuchal cord  x3 noted and needed clamping and cutting. Now the cord was released and the head delivery was completed by lifting the baby up and putting a finger in the mouth to create head flexion and complete head delivery. Apgar scores of 2 at one minute and 9 at five minutes. Cord ph was sent the umbilical cord was clamped and cut cord blood was obtained for evaluation. The placenta was removed Intact and appeared normal. The uterine outline, tubes and ovaries appeared normal. Small extension of hysterotomy noted on her right side and was sutured with 0-Monocryl.  The uterine incision was closed with running locked sutures of in 2 layers .  Hemostasis was observed. Peritoneal closure done with 2-0Vicryl. Pyramidalis sutured back in the midline.  The fascia was then reapproximated with running sutures of 0Vicryl. The subcuticular closure was performed using 4-0Vicryl. Steristrips and Honeycomb  dressing applied.  Instrument, sponge, and needle counts were correct prior the abdominal closure and were correct at the conclusion of the case.    Findings: Female infant delivered from Complete breech position via breech extraction. 3 tight nuchal cords noted and was clamped and cut before head delivery. Head delivery was difficult due to head extension and completed at 14.39 hrs. Baby was immediately handed off to NICU team since he was limp at birth. Apgars 2 and 9 at 1 and 5 minutes. Clear amniotic fluid. Normal placenta. Cord gas sent. Normal ovaries and tubes. Peritoneal shunt noted. Cord Gas(arterial) 7.37    Estimated Blood Loss: 500 cc  Total IV Fluids: 2000 cc LR   Urine Output: 650CC OF clear urine  Specimens: Cord gas and cord blood    Complications: no complications  Disposition: PACU - hemodynamically stable.  Maternal Condition: stable   Baby condition / location:  Couplet care / Skin to Skin  Attending Attestation: I performed the procedure.   Signed: Surgeon(s): Robley Fries,  MD

## 2014-06-23 NOTE — Anesthesia Postprocedure Evaluation (Signed)
  Anesthesia Post-op Note  Patient: Kristina Aguilar  Procedure(s) Performed: Procedure(s) with comments: Primary CESAREAN SECTION (N/A) - EDD: 06/27/14  Patient is awake, responsive, moving her legs, and has signs of resolution of her numbness. Pain and nausea are reasonably well controlled. Vital signs are stable and clinically acceptable. Oxygen saturation is clinically acceptable. There are no apparent anesthetic complications at this time. Patient is ready for discharge.

## 2014-06-24 LAB — CBC
HCT: 33.6 % — ABNORMAL LOW (ref 36.0–46.0)
HEMOGLOBIN: 12 g/dL (ref 12.0–15.0)
MCH: 32.3 pg (ref 26.0–34.0)
MCHC: 35.7 g/dL (ref 30.0–36.0)
MCV: 90.6 fL (ref 78.0–100.0)
Platelets: 126 10*3/uL — ABNORMAL LOW (ref 150–400)
RBC: 3.71 MIL/uL — AB (ref 3.87–5.11)
RDW: 13.7 % (ref 11.5–15.5)
WBC: 14.7 10*3/uL — ABNORMAL HIGH (ref 4.0–10.5)

## 2014-06-24 NOTE — Anesthesia Postprocedure Evaluation (Signed)
  Anesthesia Post-op Note  Patient: Kristina Aguilar  Procedure(s) Performed: Procedure(s) with comments: Primary CESAREAN SECTION (N/A) - EDD: 06/27/14  Patient Location: PACU and Mother/Baby  Anesthesia Type:Spinal  Level of Consciousness: awake, alert  and oriented  Airway and Oxygen Therapy: Patient Spontanous Breathing  Post-op Pain: mild  Post-op Assessment: Patient's Cardiovascular Status Stable, Respiratory Function Stable, No signs of Nausea or vomiting, Adequate PO intake and Pain level controlled  Post-op Vital Signs: Reviewed and stable  Last Vitals:  Filed Vitals:   06/24/14 0626  BP: 103/55  Pulse: 63  Temp: 36.4 C  Resp:     Complications: No apparent anesthesia complications

## 2014-06-24 NOTE — Lactation Note (Signed)
This note was copied from the chart of Kristina Yasmin Bronaugh. Lactation Consultation Note Initial consultation; baby 15 hours old. Mom holding, trying to get baby latched.  Assisted mom to adjust position and attempted to latch baby. Baby awake, nuzzling and licking but did not latch. Encouraged mom to continue frequent STS and cue based feeding.  Reviewed hand expression, mom has good amount of colostrum. Reviewed br feeding basics, baby and me book, lactation brochure, community resources. Enc mom to call for help if she has any concerns.   Patient Name: Kristina Aguilar Date: 06/24/2014 Reason for consult: Initial assessment   Maternal Data Formula Feeding for Exclusion: No Has patient been taught Hand Expression?: Yes Does the patient have breastfeeding experience prior to this delivery?: No  Feeding    LATCH Score/Interventions Latch: Too sleepy or reluctant, no latch achieved, no sucking elicited.     Type of Nipple: Everted at rest and after stimulation  Comfort (Breast/Nipple): Soft / non-tender     Hold (Positioning): Assistance needed to correctly position infant at breast and maintain latch.     Lactation Tools Discussed/Used     Consult Status Consult Status: Follow-up Follow-up type: In-patient    Octavio Manns [facility] 06/24/2014, 1:07 PM

## 2014-06-24 NOTE — Addendum Note (Signed)
Addendum created 06/24/14 0906 by Elbert Ewings, CRNA   Modules edited: Charges VN, Notes Section   Notes Section:  File: [id number]

## 2014-06-24 NOTE — Progress Notes (Signed)
POSTOPERATIVE DAY # 1 S/P CS - breech   S:         Reports feeling well - some soreness more than yesterday             Tolerating po intake / no nausea / no vomiting / + flatus / no BM             Bleeding is light             Pain controlled with motrin and percocet             Up ad lib / ambulatory/ voiding QS  Newborn breast feeding   O:  VS: BP 104/53  Pulse 96  Temp(Src) 97.5 F (36.4 C) (Oral)  Resp 20  Wt 71.668 kg (158 lb)  SpO2 100%  Breastfeeding? Unknown   LABS:               Recent Labs  06/22/14 1605 06/24/14 0543  WBC 10.8* 14.7*  HGB 13.2 12.0  PLT 162 126*               Bloodtype: --/--/O POS, O POS (08/27 1605)  Rubella: Immune (01/05 0000)                                             I&O: Intake/Output     08/28 0701 - 08/29 0700 08/29 0701 - 08/30 0700   P.O. 1920    I.V. (mL/kg) 2090 (29.2)    Total Intake(mL/kg) 4010 (56)    Urine (mL/kg/hr) 3850 1000 (4.2)   Blood 500    Total Output 4350 1000   Net -340 -1000                     Physical Exam:             Alert and Oriented X3  Lungs: Clear and unlabored  Heart: regular rate and rhythm / no mumurs  Abdomen: soft, non-tender, non-distended with active BS             Fundus: firm, non-tender, U-1             Dressing intact honeycomb              Incision:  approximated with suture / no erythema / no ecchymosis / no drainage  Perineum: intact  Lochia: light  Extremities: no edema, no calf pain or tenderness, negative Homans  A:        POD # 1 S/P CS - breech             P:        Routine postoperative care              anticipate early DC tomorrow if postoperative course continues to advance normally   Marlinda Mike CNM, MSN, [facility] 06/24/2014, 10:22 AM

## 2014-06-25 MED ORDER — IBUPROFEN 600 MG PO TABS
600.0000 mg | ORAL_TABLET | Freq: Four times a day (QID) | ORAL | Status: DC
Start: 2014-06-25 — End: 2016-07-13

## 2014-06-25 NOTE — Progress Notes (Signed)
POSTOPERATIVE DAY # 2 S/P CS breech   S:         Reports feeling ok - tired             Tolerating po intake / no nausea / no vomiting / + flatus / no BM             Bleeding is light             Pain controlled with motrin and tylenol only             Up ad lib / ambulatory/ voiding QS  Newborn breast feeding  / Circumcision today  O:  VS: BP 99/47  Pulse 62  Temp(Src) 98.7 F (37.1 C) (Oral)  Resp 18  Wt 71.668 kg (158 lb)  SpO2 99%  Breastfeeding? Unknown            Physical Exam:             Alert and Oriented X3  Lungs: Clear and unlabored  Heart: regular rate and rhythm / no mumurs  Abdomen: soft, non-tender, non-distended active BS             Fundus: firm, non-tender, U-1             Dressing intact honeycomb              Incision:  approximated with sutures / no erythema / no ecchymosis / dried drainage  Perineum: intact  Lochia: light  Extremities: no edema, no calf pain or tenderness, neg Homans  A:        POD # 2 S/P CS           P:        Routine postoperative care              DC home / WOB booklet / instructions reviewed   Marlinda Mike CNM, MSN, FACNM 06/25/2014, 9:08 AM

## 2014-06-25 NOTE — Discharge Summary (Signed)
POSTOPERATIVE DISCHARGE SUMMARY:  Patient ID: Kristina Aguilar MRN: [id number] DOB/AGE: [birth year] 23 y.o.  Admit date: 06/23/2014 Admission Diagnoses: 39.3 weeks / breech presentation  Discharge date:  06/25/2014 Discharge Diagnoses: POD 2 s/p cesarean section - breech with triple nuchal cord  Prenatal history: G1P1001   EDC : 06/27/2014, by Other Basis  Prenatal care at [facility]  Primary provider :Raelyn Mora CNM Prenatal course complicated by breech / Scoliosis / ventriculoperitoneal shunt for hydrocephalus  Prenatal Labs: ABO, Rh: --/--/O POS, O POS (08/27 1605)  Antibody: NEG (08/27 1605) Rubella: Immune (01/05 0000)   RPR: NON REAC (08/27 1605)  HBsAg: Negative (01/05 0000)  HIV: Non-reactive (01/05 0000)  GTT : NL  Medical / Surgical History :  Past medical history:  Past Medical History  Diagnosis Date  . Hydrocephalus with operating shunt     surgery 2001 and then revised 2002, Neurologist Dr Alvester Morin at [place]  . GERD (gastroesophageal reflux disease)   . Scoliosis   . Anemia     hx 2011  . Postpartum care following cesarean delivery (8/28) 06/23/2014    Past surgical history:  Past Surgical History  Procedure Laterality Date  . Ventriculoperitoneal shunt      09/2000 and revised in 03/2001  . Wisdom tooth extraction  2007  . Colonoscopy      Family History:  Family History  Problem Relation Age of Onset  . Asthma Mother   . Hypertension Mother   . Hypertension Father     Social History:  reports that she has never smoked. She has never used smokeless tobacco. She reports that she does not drink alcohol or use illicit drugs.  Allergies: Review of patient's allergies indicates no known allergies.   Current Medications at time of admission:  Prior to Admission medications   Medication Sig Start Date End Date Taking? Authorizing Provider  Prenatal Vit-Fe Fumarate-FA (PRENATAL MULTIVITAMIN) TABS tablet Take 1 tablet by mouth daily  at 12 noon.   Yes Historical Provider, MD  ranitidine (ZANTAC) 150 MG capsule Take 150 mg by mouth daily as needed for heartburn.   Yes Historical Provider, MD  ibuprofen (ADVIL,MOTRIN) 600 MG tablet Take 1 tablet (600 mg total) by mouth every 6 (six) hours. 06/25/14   Marlinda Mike, CNM   Intrapartum Course:  Procedures: Cesarean section delivery on 8/28 with delivery of  female newborn by Dr Juliene Pina   See operative report for further details APGAR (1 MIN): 2   APGAR (5 MINS): 9    Postoperative / postpartum course:  Uncomplicated with discharge on POD 2  Discharge Instructions: Discharged Condition: stable Activity: pelvic rest and postoperative restrictions x 2  Diet: routine  Medications:    Medication List         ibuprofen 600 MG tablet  Commonly known as:  ADVIL,MOTRIN  Take 1 tablet (600 mg total) by mouth every 6 (six) hours.     prenatal multivitamin Tabs tablet  Take 1 tablet by mouth daily at 12 noon.     ranitidine 150 MG capsule  Commonly known as:  ZANTAC  Take 150 mg by mouth daily as needed for heartburn.        Wound Care: keep clean and dry / remove honeycomb POD 5 Postpartum Instructions: [facility] discharge booklet - instructions reviewed  Discharge to: Home  Follow up :   [facility] in 6 weeks for routine postpartum visit with Raelyn Mora CNM  Signed: Marlinda Mike CNM, MSN, [facility] 06/25/2014, 9:43 AM

## 2014-06-26 ENCOUNTER — Encounter (HOSPITAL_COMMUNITY): Payer: Self-pay | Admitting: Obstetrics & Gynecology

## 2014-06-26 NOTE — Discharge Summary (Signed)
Reviewed and agree with note and plan. V.Maks Cavallero, MD  

## 2014-08-28 ENCOUNTER — Encounter (HOSPITAL_COMMUNITY): Payer: Self-pay | Admitting: Obstetrics & Gynecology

## 2015-10-28 HISTORY — DX: Maternal care for unspecified type scar from previous cesarean delivery: O34.219

## 2015-10-28 NOTE — L&D Delivery Note (Signed)
Assumed care of pt at 0800 / TC from Scheryl Martenhristine Soliz, RN at [phone] reporting pt having lots of bloody show and rectal pressure / ordered to check cervix / reports complete dilation and fetal head at +3 station upon CNM arrival to hospital.  Delivery Note  First Stage: Labor onset: 2300 on 07/13/2016 Augmentation : Pitcoin Analgesia /Anesthesia intrapartum: nitrous oxide then epidural PROM at 2300 on 07/12/2016  Second Stage: Complete dilation at 0809 on 07/14/2016 Onset of pushing at 0810 FHR second stage 130s then 110 at time of delivery  Delivery of a viable female at [id number] by CNM in OA position with restitution to LOA No nuchal cord Cord double clamped after cessation of pulsation, cut by FOB Cord blood sample collected   Third Stage: Placenta delivered via Tomasa BlaseSchultz intact with 3 VC @ [phone] Placenta disposition: L&D Uterine tone firm / bleeding slow continuous trickle noted - Cytotec 800 mcg rectally given  2nd degree and bilateral labial lacerations identified  Anesthesia for repair: epidural Repair 2-0 x 2, 3-0, 4-0 Est. Blood Loss (mL): 250  Complications: continued trickle of bleeding   Mom to postpartum.  Baby to Couplet care / Skin to Skin.  Newborn: Birth Weight: 7 lbs 7.6 oz  Apgar Scores: 9/9 Feeding planned: breast / infant nursing immediately after delivery without difficulty  Kenard GowerDAWSON, Kynan Peasley, M  MSN, CNM 07/14/2016, 9:13 AM

## 2015-12-14 LAB — OB RESULTS CONSOLE RUBELLA ANTIBODY, IGM: Rubella: IMMUNE

## 2015-12-14 LAB — OB RESULTS CONSOLE GC/CHLAMYDIA
CHLAMYDIA, DNA PROBE: NEGATIVE
GC PROBE AMP, GENITAL: NEGATIVE

## 2015-12-14 LAB — OB RESULTS CONSOLE HEPATITIS B SURFACE ANTIGEN: Hepatitis B Surface Ag: NEGATIVE

## 2015-12-14 LAB — OB RESULTS CONSOLE HIV ANTIBODY (ROUTINE TESTING): HIV: NONREACTIVE

## 2015-12-14 LAB — OB RESULTS CONSOLE RPR: RPR: NONREACTIVE

## 2016-05-22 LAB — OB RESULTS CONSOLE GBS: STREP GROUP B AG: NEGATIVE

## 2016-07-13 ENCOUNTER — Inpatient Hospital Stay (HOSPITAL_COMMUNITY)
Admission: AD | Admit: 2016-07-13 | Discharge: 2016-07-15 | DRG: 775 | Disposition: A | Discharge status: Home / Self Care | Payer: Managed Care, Other (non HMO) | Source: Ambulatory Visit | Attending: Obstetrics and Gynecology | Admitting: Obstetrics and Gynecology

## 2016-07-13 ENCOUNTER — Encounter (HOSPITAL_COMMUNITY): Payer: Self-pay | Admitting: *Deleted

## 2016-07-13 DIAGNOSIS — M419 Scoliosis, unspecified: Secondary | ICD-10-CM | POA: Diagnosis present

## 2016-07-13 DIAGNOSIS — O34211 Maternal care for low transverse scar from previous cesarean delivery: Secondary | ICD-10-CM | POA: Diagnosis present

## 2016-07-13 DIAGNOSIS — Z3A40 40 weeks gestation of pregnancy: Secondary | ICD-10-CM

## 2016-07-13 DIAGNOSIS — O4202 Full-term premature rupture of membranes, onset of labor within 24 hours of rupture: Secondary | ICD-10-CM | POA: Diagnosis present

## 2016-07-13 DIAGNOSIS — O9962 Diseases of the digestive system complicating childbirth: Secondary | ICD-10-CM | POA: Diagnosis present

## 2016-07-13 DIAGNOSIS — Z8249 Family history of ischemic heart disease and other diseases of the circulatory system: Secondary | ICD-10-CM | POA: Diagnosis not present

## 2016-07-13 DIAGNOSIS — K219 Gastro-esophageal reflux disease without esophagitis: Secondary | ICD-10-CM | POA: Diagnosis present

## 2016-07-13 DIAGNOSIS — O34219 Maternal care for unspecified type scar from previous cesarean delivery: Secondary | ICD-10-CM | POA: Diagnosis not present

## 2016-07-13 LAB — CBC
HEMATOCRIT: 36.6 % (ref 36.0–46.0)
HEMOGLOBIN: 12.7 g/dL (ref 12.0–15.0)
MCH: 31.4 pg (ref 26.0–34.0)
MCHC: 34.7 g/dL (ref 30.0–36.0)
MCV: 90.4 fL (ref 78.0–100.0)
Platelets: 156 10*3/uL (ref 150–400)
RBC: 4.05 MIL/uL (ref 3.87–5.11)
RDW: 14.4 % (ref 11.5–15.5)
WBC: 10.9 10*3/uL — AB (ref 4.0–10.5)

## 2016-07-13 LAB — TYPE AND SCREEN
ABO/RH(D): O POS
ANTIBODY SCREEN: NEGATIVE

## 2016-07-13 LAB — POCT FERN TEST: POCT FERN TEST: POSITIVE

## 2016-07-13 LAB — RPR: RPR Ser Ql: NONREACTIVE

## 2016-07-13 MED ORDER — ONDANSETRON HCL 4 MG/2ML IJ SOLN: 4.0000 mg | Freq: Four times a day (QID) | INTRAMUSCULAR | Status: DC | PRN

## 2016-07-13 MED ORDER — OXYCODONE-ACETAMINOPHEN 5-325 MG PO TABS: 1.0000 | ORAL_TABLET | ORAL | Status: DC | PRN

## 2016-07-13 MED ORDER — TERBUTALINE SULFATE 1 MG/ML IJ SOLN
0.2500 mg | Freq: Once | INTRAMUSCULAR | Status: DC | PRN
  Filled 2016-07-13: qty 1

## 2016-07-13 MED ORDER — FLEET ENEMA 7-19 GM/118ML RE ENEM: 1.0000 | ENEMA | RECTAL | Status: DC | PRN

## 2016-07-13 MED ORDER — OXYTOCIN BOLUS FROM INFUSION: 500.0000 mL | Freq: Once | INTRAVENOUS | Status: DC

## 2016-07-13 MED ORDER — OXYCODONE-ACETAMINOPHEN 5-325 MG PO TABS: 2.0000 | ORAL_TABLET | ORAL | Status: DC | PRN

## 2016-07-13 MED ORDER — SOD CITRATE-CITRIC ACID 500-334 MG/5ML PO SOLN: 30.0000 mL | ORAL | Status: DC | PRN

## 2016-07-13 MED ORDER — LIDOCAINE HCL (PF) 1 % IJ SOLN
30.0000 mL | INTRAMUSCULAR | Status: DC | PRN
  Filled 2016-07-13: qty 30

## 2016-07-13 MED ORDER — LACTATED RINGERS IV SOLN
500.0000 mL | INTRAVENOUS | Status: DC | PRN
  Administered 2016-07-13: 1000 mL via INTRAVENOUS

## 2016-07-13 MED ORDER — OXYTOCIN 40 UNITS IN LACTATED RINGERS INFUSION - SIMPLE MED
1.0000 m[IU]/min | INTRAVENOUS | Status: DC
  Administered 2016-07-13: 2 m[IU]/min via INTRAVENOUS

## 2016-07-13 MED ORDER — LACTATED RINGERS IV SOLN
INTRAVENOUS | Status: DC
  Administered 2016-07-13: 02:00:00 via INTRAVENOUS

## 2016-07-13 MED ORDER — ACETAMINOPHEN 325 MG PO TABS: 650.0000 mg | ORAL_TABLET | ORAL | Status: DC | PRN

## 2016-07-13 MED ORDER — OXYTOCIN 40 UNITS IN LACTATED RINGERS INFUSION - SIMPLE MED
2.5000 [IU]/h | INTRAVENOUS | Status: DC
  Filled 2016-07-13: qty 1000

## 2016-07-13 NOTE — H&P (Signed)
OB ADMISSION/ HISTORY & PHYSICAL:  Admission Date: 07/13/2016 12:19 AM  Admit Diagnosis: 40 weeks with PROM / Previous CS - desires TOLAC  Kristina Aguilar is a 25 y.o. female presenting for SROM - clear fluid without labor. Previous CS  Prenatal History: G2P1001   EDC : 07/12/2016, by Other Basis  Prenatal care at [facility]  Primary Ob Provider: Raelyn Mora CNM  Prenatal course complicated by:  1)  hydrocephalus as child - shunt in place  (managed by Dr Alvester Morin - no restrictions for delivery / shunt no longer needed & no concerns for SVB)  2) Previous CS - NRFHR / breech in labor (triple nuchal cord)   3) mild scoliosis  Prenatal Labs: ABO, Rh: --/--/O POS (09/17 0135) Antibody: NEG (09/17 0135) Rubella:   Immune RPR:   NR HBsAg:   Negative HIV:   NR GTT: NL GBS: Negative (07/27 0000)   Medical / Surgical History :  Past medical history:  Past Medical History:  Diagnosis Date  . Anemia    hx 2011  . GERD (gastroesophageal reflux disease)   . Hydrocephalus with operating shunt    surgery 2001 and then revised 2002, Neurologist Dr Alvester Morin at [place]  . Postpartum care following cesarean delivery (8/28) 06/23/2014  . Scoliosis      Past surgical history:  Past Surgical History:  Procedure Laterality Date  . CESAREAN SECTION N/A 06/23/2014   Procedure: Primary CESAREAN SECTION;  Surgeon: Robley Fries, MD;  Location: [facility];  Service: Obstetrics;  Laterality: N/A;  EDD: 06/27/14  . COLONOSCOPY    . VENTRICULOPERITONEAL SHUNT     09/2000 and revised in 03/2001  . WISDOM TOOTH EXTRACTION  2007    Family History:  Family History  Problem Relation Age of Onset  . Asthma Mother   . Hypertension Mother   . Hypertension Father      Social History:  reports that she has never smoked. She has never used smokeless tobacco. She reports that she does not drink alcohol or use drugs.   Allergies: Review of patient's allergies indicates no known  allergies.    Current Medications at time of admission:  Prior to Admission medications   Medication Sig Start Date End Date Taking? Authorizing Provider  Prenatal Vit-Fe Fumarate-FA (PRENATAL MULTIVITAMIN) TABS tablet Take 1 tablet by mouth daily at 12 noon.    Historical Provider, MD  ranitidine (ZANTAC) 150 MG capsule Take 150 mg by mouth daily as needed for heartburn.    Historical Provider, MD   Review of Systems: Active FM Rare ctx LOF  / SROM @ 11pm on 9/16 bloody show none   Physical Exam:  VS: Blood pressure 126/75, pulse 90, temperature 98.9 F (37.2 C), temperature source Oral, resp. rate 19, height 5\' 11"  (1.803 m), weight 76.2 kg (168 lb), unknown if currently breastfeeding.  General: alert and oriented, appears calm and comfortable Heart: RRR Lungs: Clear lung fields Abdomen: Gravid, soft and non-tender, non-distended / uterus: gravid Extremities: no  edema  Genitalia / VE: Dilation: 3 Effacement (%): 100 Station: +1 Exam by:: T. Fredric Mare, CNM  FHR: baseline rate 145 / variability moderate / accelerations + / no decelerations TOCO: ctx Q10-12 mild  Assessment: [redacted] weeks gestation with PROM prodromal stage of labor FHR category 1 Previous CS - strong desire for TOLAC Hx hydrocephalus with shunt - medically cleared by neurology for normal labor and birth Negative GBS  Plan:  Admit Expectant management - patient declines  augmentation on admission to await labor  Dr Cherly Hensenousins notified of admission / plan of care   Marlinda MikeBAILEY, Nicolaas Savo CNM, MSN, [facility] 07/13/2016, 11:09 AM

## 2016-07-13 NOTE — MAU Note (Signed)
Patient presents to mau with c/o leaking clear fluid since 11pm. Denies vagnial bleeding. +FM. Occassional contraction

## 2016-07-13 NOTE — Progress Notes (Signed)
S:  No big change - ready to proceed with pitocin  O:  VS: Blood pressure 126/72, pulse 75, temperature 98 F (36.7 C), temperature source Oral, resp. rate 18, height 5\' 11"  (1.803 m), weight 76.2 kg (168 lb), unknown if currently breastfeeding.        FHR : baseline 135 / variability moderate / accelerations + / no decelerations        Toco: contractions rare and irregular / mild        Membranes: clear  A: prodromal labor     FHR category 1  P: pitocin augmentation    Susa LofflerBAILEY, Cigi Bega CNM, MSN, FACNM 07/13/2016, 9:11 PM

## 2016-07-13 NOTE — Progress Notes (Signed)
S:  slept about 4 hours - feeling ctx but not very close       clear fluid leakage  O:  VS: Blood pressure 126/75, pulse 90, temperature 98.9 F (37.2 C), temperature source Oral, resp. rate 19, height 5\' 11"  (1.803 m), weight 76.2 kg (168 lb), unknown if currently breastfeeding.        FHR : baseline 140 / variability moderate / accelerations + / no decelerations        Toco: contractions every 10-12 minutes / mild         Cervix : 3/ 95% / vtx +1        Membranes: clear fluids  A: prodromal - early latent labor     FHR category 1  P:   Discussed augmentation options - pitocin low dose recommended as best option at this time - no benefit with cervical balloon & no other medication used with TOLAC  Desires to wait "a little longer" - desires to ambulate and try warm shower  Reviewed risk for CS equivalent if pitocin augment versus expectant management without any augmentation - both have risk for repeat CS. Understands & will consider pitocin if no labor in few hours.  Expectant management - activity ad lib - no VE until active labor     Marlinda MikeBAILEY, Shaughn Thomley CNM, MSN, [facility] 07/13/2016, 0910am

## 2016-07-14 ENCOUNTER — Inpatient Hospital Stay (HOSPITAL_COMMUNITY): Payer: Managed Care, Other (non HMO) | Admitting: Anesthesiology

## 2016-07-14 ENCOUNTER — Encounter (HOSPITAL_COMMUNITY): Payer: Self-pay | Admitting: Obstetrics and Gynecology

## 2016-07-14 DIAGNOSIS — O34219 Maternal care for unspecified type scar from previous cesarean delivery: Secondary | ICD-10-CM

## 2016-07-14 MED ORDER — PRENATAL MULTIVITAMIN CH
1.0000 | ORAL_TABLET | Freq: Every day | ORAL | Status: DC
  Administered 2016-07-14 – 2016-07-15 (×2): 1 via ORAL
  Filled 2016-07-14 (×2): qty 1

## 2016-07-14 MED ORDER — COCONUT OIL OIL: 1.0000 "application " | TOPICAL_OIL | Status: DC | PRN

## 2016-07-14 MED ORDER — OXYCODONE HCL 5 MG PO TABS: 10.0000 mg | ORAL_TABLET | ORAL | Status: DC | PRN

## 2016-07-14 MED ORDER — EPHEDRINE 5 MG/ML INJ: 10.0000 mg | INTRAVENOUS | Status: DC | PRN

## 2016-07-14 MED ORDER — PHENYLEPHRINE 40 MCG/ML (10ML) SYRINGE FOR IV PUSH (FOR BLOOD PRESSURE SUPPORT): 80.0000 ug | PREFILLED_SYRINGE | INTRAVENOUS | Status: DC | PRN

## 2016-07-14 MED ORDER — FENTANYL 2.5 MCG/ML BUPIVACAINE 1/10 % EPIDURAL INFUSION (WH - ANES): 14.0000 mL/h | INTRAMUSCULAR | Status: DC | PRN

## 2016-07-14 MED ORDER — WITCH HAZEL-GLYCERIN EX PADS: 1.0000 "application " | MEDICATED_PAD | CUTANEOUS | Status: DC | PRN

## 2016-07-14 MED ORDER — PHENYLEPHRINE 40 MCG/ML (10ML) SYRINGE FOR IV PUSH (FOR BLOOD PRESSURE SUPPORT)
PREFILLED_SYRINGE | INTRAVENOUS | Status: DC
Start: 2016-07-14 — End: 2016-07-14
  Filled 2016-07-14: qty 20

## 2016-07-14 MED ORDER — SIMETHICONE 80 MG PO CHEW: 80.0000 mg | CHEWABLE_TABLET | ORAL | Status: DC | PRN

## 2016-07-14 MED ORDER — ONDANSETRON HCL 4 MG PO TABS: 4.0000 mg | ORAL_TABLET | ORAL | Status: DC | PRN

## 2016-07-14 MED ORDER — FENTANYL 2.5 MCG/ML BUPIVACAINE 1/10 % EPIDURAL INFUSION (WH - ANES)
INTRAMUSCULAR | Status: AC
  Filled 2016-07-14: qty 125

## 2016-07-14 MED ORDER — FENTANYL 2.5 MCG/ML BUPIVACAINE 1/10 % EPIDURAL INFUSION (WH - ANES)
14.0000 mL/h | INTRAMUSCULAR | Status: DC | PRN
  Administered 2016-07-14: 14 mL/h via EPIDURAL

## 2016-07-14 MED ORDER — LIDOCAINE HCL (PF) 1 % IJ SOLN
INTRAMUSCULAR | Status: DC | PRN
  Administered 2016-07-14 (×2): 5 mL

## 2016-07-14 MED ORDER — LACTATED RINGERS IV SOLN
INTRAVENOUS | Status: DC
  Administered 2016-07-14: 07:00:00 via INTRAVENOUS

## 2016-07-14 MED ORDER — OXYCODONE HCL 5 MG PO TABS
5.0000 mg | ORAL_TABLET | ORAL | Status: DC | PRN
  Administered 2016-07-14 – 2016-07-15 (×2): 5 mg via ORAL
  Filled 2016-07-14 (×2): qty 1

## 2016-07-14 MED ORDER — DIBUCAINE 1 % RE OINT: 1.0000 "application " | TOPICAL_OINTMENT | RECTAL | Status: DC | PRN

## 2016-07-14 MED ORDER — DIPHENHYDRAMINE HCL 25 MG PO CAPS: 25.0000 mg | ORAL_CAPSULE | Freq: Four times a day (QID) | ORAL | Status: DC | PRN

## 2016-07-14 MED ORDER — ACETAMINOPHEN 325 MG PO TABS: 650.0000 mg | ORAL_TABLET | ORAL | Status: DC | PRN

## 2016-07-14 MED ORDER — ONDANSETRON HCL 4 MG/2ML IJ SOLN: 4.0000 mg | INTRAMUSCULAR | Status: DC | PRN

## 2016-07-14 MED ORDER — EPHEDRINE 5 MG/ML INJ
10.0000 mg | INTRAVENOUS | Status: DC | PRN
  Filled 2016-07-14: qty 4

## 2016-07-14 MED ORDER — DIPHENHYDRAMINE HCL 50 MG/ML IJ SOLN: 12.5000 mg | INTRAMUSCULAR | Status: DC | PRN

## 2016-07-14 MED ORDER — PHENYLEPHRINE 40 MCG/ML (10ML) SYRINGE FOR IV PUSH (FOR BLOOD PRESSURE SUPPORT)
80.0000 ug | PREFILLED_SYRINGE | INTRAVENOUS | Status: DC | PRN
  Filled 2016-07-14: qty 5

## 2016-07-14 MED ORDER — LACTATED RINGERS IV SOLN: 500.0000 mL | Freq: Once | INTRAVENOUS | Status: DC

## 2016-07-14 MED ORDER — MISOPROSTOL 200 MCG PO TABS
800.0000 ug | ORAL_TABLET | Freq: Once | ORAL | Status: AC
  Administered 2016-07-14: 800 ug via RECTAL

## 2016-07-14 MED ORDER — BENZOCAINE-MENTHOL 20-0.5 % EX AERO
1.0000 "application " | INHALATION_SPRAY | CUTANEOUS | Status: DC | PRN
  Administered 2016-07-14 – 2016-07-15 (×2): 1 via TOPICAL
  Filled 2016-07-14 (×2): qty 56

## 2016-07-14 MED ORDER — ZOLPIDEM TARTRATE 5 MG PO TABS: 5.0000 mg | ORAL_TABLET | Freq: Every evening | ORAL | Status: DC | PRN

## 2016-07-14 MED ORDER — IBUPROFEN 600 MG PO TABS
600.0000 mg | ORAL_TABLET | Freq: Four times a day (QID) | ORAL | Status: DC
  Administered 2016-07-14 – 2016-07-15 (×6): 600 mg via ORAL
  Filled 2016-07-14 (×6): qty 1

## 2016-07-14 MED ORDER — MISOPROSTOL 200 MCG PO TABS
ORAL_TABLET | ORAL | Status: AC
  Filled 2016-07-14: qty 4

## 2016-07-14 MED ORDER — SENNOSIDES-DOCUSATE SODIUM 8.6-50 MG PO TABS
2.0000 | ORAL_TABLET | ORAL | Status: DC
  Administered 2016-07-14: 2 via ORAL
  Filled 2016-07-14: qty 2

## 2016-07-14 NOTE — Anesthesia Postprocedure Evaluation (Signed)
Anesthesia Post Note  Patient: Sharol RousselHeather E Gootee  Procedure(s) Performed: * No procedures listed *  Patient location during evaluation: Mother Baby Anesthesia Type: Epidural Level of consciousness: awake and alert Pain management: pain level controlled Vital Signs Assessment: post-procedure vital signs reviewed and stable Respiratory status: spontaneous breathing and nonlabored ventilation Cardiovascular status: stable Postop Assessment: no headache, no backache, patient able to bend at knees, epidural receding, no signs of nausea or vomiting and adequate PO intake Anesthetic complications: no     Last Vitals:  Vitals:   07/14/16 1016 07/14/16 1038  BP: 117/61 109/67  Pulse: 63 77  Resp: 18 20  Temp:  36.4 C    Last Pain:  Vitals:   07/14/16 1050  TempSrc:   PainSc: 0-No pain   Pain Goal: Patients Stated Pain Goal: 10 (07/13/16 1815)               Donnalee CurryMalinova,Rhya Shan Hristova

## 2016-07-14 NOTE — Progress Notes (Addendum)
Patient ID: Sharol RousselHeather E Lose, female   DOB: 15-Sep-1991, 25 y.o.   MRN: [id number]  TC from Scheryl Martenhristine Soliz, RN reporting continuation of the slow trickle of blood noted after delivery despite Cytotec and uterus being firm, U-even and midline. Inocencio HomesSoliz, RN reports that "Cytotec helped for a little while, but the trickle started back again". Request for CNM to re-evaluate pt [facility]  Upon arrival to room for re-evaluation, Inocencio HomesSoliz, RN reports that she emptied 50 mL of urine from the pt's bladder and changed the pt's peripad. Now she notes the pt's bleeding is normal, uterus remains even with umbilicus, firm and midline.  Sterile gloves donned and visual inspection of labial/perineal repair, vagina and cervix was made with no abnormal findings. Notable edema of labia and perineal areas found, but not out of the ordinary for immediate s/p vaginal/perineal repair. Patient asymptomatic, stable happy about delivery and that baby nursed without difficulties.  Raelyn MoraAWSON, Rosi Secrist, M MSN, CNM 07/14/2016 10:00 AM

## 2016-07-14 NOTE — Progress Notes (Signed)
S:  Sleeping since epidural at 0400 - did not awaken  O:  VS: Blood pressure 132/72, pulse 75, temperature 98 F (36.7 C), temperature source Oral, resp. rate 18, height 5\' 11"  (1.803 m), weight 76.2 kg (168 lb), unknown if currently breastfeeding.        FHR : baseline 145 / variability moderate / accelerations present / few variable/early decelerations        Toco: contractions every 2-3 minutes / pitocin 6 mu/min         Cervix : deferred ( last exam after epidural by nurse 5-6cm)         A: active labor     FHR category 2  P: continue current management - anticipate SVB/VBAC     Marlinda MikeBAILEY, Herndon Grill CNM, MSN, [facility] 07/14/2016, 7:57 AM

## 2016-07-14 NOTE — Progress Notes (Signed)
Nitrous not effective - screaming thru mask        Requesting pitocin OFF (already cut back to 2 mu)         Anesthesia and staff discussed epidural at length, several times - worried about scoliosis and shunt  Explained previous spinal/epidural without issues and Dr Alvester MorinBell cleared for normal labor / birth and anesthesia required for childbirth either vaginal or CS. Patient and spouse [facility] - anesthesia declines to call for consult in the middle of the night when decision was previously made and patient has received regional anesthesia in past.  Patient request pitocin off and neurology to be called in AM at 0700. She is tired and just wants to slepp   A: active labor     FHR category 1  P: poor pain control      encouraged epidural placement that ctx will not stop expectant management - no evidence of infection  Marlinda MikeBAILEY, TANYA CNM, MSN, [facility] 07/14/2016, [id number]

## 2016-07-14 NOTE — Progress Notes (Signed)
  In to talk with patient & spouse.  States she has been walking intermittently/ shower with nipple stimulation / birth ball / good hydration with water.  Reports ctx closer and stronger.  FHR intermittently - reactive with Q2 hour EFM Toco shows ctx Q 10-15 minutes   Discussed not much change in ctx pattern - encouraged to consider pitocin augmentation. Fatigue is factor to consider if nothing happens to progress labor and we need to augment. Reassured normal FHR and afebrile.  Desires to wait a little longer - then decide. Recommended we can start pitocin to get labor progressing then stop once labor is active if she desires. If labor stalls again - we can restart pitocin. Will consider.   Marlinda Mikeanya Tailer Volkert CNM [facility] 07/13/2016 1235PM

## 2016-07-14 NOTE — Lactation Note (Signed)
This note was copied from a baby's chart. Lactation Consultation Note  Patient Name: Kristina Aguilar FolksHeather Pargas ZOXWR'UToday's Date: 07/14/2016 Reason for consult: Initial assessment Baby at 8 hr of life. Experienced bf mom reports feedings are going well. She denies breast or nipple pain, voiced no concerns. RN reports baby is bf well. Discussed baby behavior, feeding frequency, baby belly size, voids, wt loss, breast changes, and nipple care. Mom stated she can manually express and has spoon in room. Given lactation handouts. Aware of OP services and support group.    Maternal Data    Feeding Feeding Type: Breast Fed Length of feed: 10 min  LATCH Score/Interventions                      Lactation Tools Discussed/Used     Consult Status Consult Status: Follow-up Date: 07/15/16 Follow-up type: In-patient    Rulon Eisenmengerlizabeth E Delton Stelle 07/14/2016, 4:55 PM

## 2016-07-14 NOTE — Anesthesia Procedure Notes (Signed)
Epidural Patient location during procedure: OB Start time: 07/14/2016 3:42 AM End time: 07/14/2016 3:47 AM  Staffing Anesthesiologist: Bonita QuinGUIDETTI, Tien Aispuro S Performed: anesthesiologist   Preanesthetic Checklist Completed: patient identified, site marked, surgical consent, pre-op evaluation, timeout performed, IV checked, risks and benefits discussed and monitors and equipment checked  Epidural Patient position: sitting Prep: site prepped and draped and DuraPrep Patient monitoring: continuous pulse ox and blood pressure Approach: midline Location: L4-L5 Injection technique: LOR air  Needle:  Needle type: Tuohy  Needle gauge: 17 G Needle length: 9 cm and 9 Needle insertion depth: 5 cm cm Catheter type: closed end flexible Catheter size: 19 Gauge Catheter at skin depth: 10 cm Test dose: negative  Assessment Events: blood not aspirated, injection not painful, no injection resistance, negative IV test and no paresthesia

## 2016-07-14 NOTE — Anesthesia Pain Management Evaluation Note (Signed)
  CRNA Pain Management Visit Note  Patient: Kristina RousselHeather E Aguilar, 25 y.o., female  "Hello I am a [facility] of the [facility]. We have an anesthesia team available at all times to provide care throughout the hospital, including epidural management and anesthesia for C-section. I don't know your plan for the delivery whether it a natural birth, water birth, IV sedation, nitrous supplementation, doula or epidural, but we want to meet your pain goals."   1.Was your pain managed to your expectations on prior hospitalizations?   Yes   2.What is your expectation for pain management during this hospitalization?     Epidural  3.How can we help you reach that goal? Maintain epidural.  Record the patient's initial score and the patient's pain goal.   Pain: 1  Pain Goal: 1 [facility] wants you to be able to say your pain was always managed very well.  Jakaden Ouzts 07/14/2016

## 2016-07-14 NOTE — Anesthesia Preprocedure Evaluation (Addendum)
Anesthesia Evaluation  Patient identified by MRN, date of birth, ID band Patient awake    Reviewed: Allergy & Precautions, H&P , NPO status , Patient's Chart, lab work & pertinent test results, reviewed documented beta blocker date and time   History of Anesthesia Complications Negative for: history of anesthetic complications  Airway Mallampati: II  TM Distance: >3 FB Neck ROM: full    Dental  (+) Teeth Intact   Pulmonary neg pulmonary ROS,    breath sounds clear to auscultation       Cardiovascular negative cardio ROS   Rhythm:regular Rate:Normal     Neuro/Psych Has a VP shunt for hydrocephalus, placed in 2001, revised in 2002. No elevated ICP.  negative psych ROS   GI/Hepatic Neg liver ROS, GERD  Medicated,  Endo/Other  negative endocrine ROS  Renal/GU negative Renal ROS  negative genitourinary   Musculoskeletal   Abdominal   Peds  Hematology negative hematology ROS (+)   Anesthesia Other Findings   Reproductive/Obstetrics (+) Pregnancy Attempting VBAC                            Anesthesia Physical  Anesthesia Plan  ASA: II  Anesthesia Plan: Epidural   Post-op Pain Management:    Induction:   Airway Management Planned:   Additional Equipment:   Intra-op Plan:   Post-operative Plan:   Informed Consent: I have reviewed the patients History and Physical, chart, labs and discussed the procedure including the risks, benefits and alternatives for the proposed anesthesia with the patient or authorized representative who has indicated his/her understanding and acceptance.     Plan Discussed with:   Anesthesia Plan Comments:        Anesthesia Quick Evaluation

## 2016-07-15 LAB — CBC
HEMATOCRIT: 28.8 % — AB (ref 36.0–46.0)
Hemoglobin: 10 g/dL — ABNORMAL LOW (ref 12.0–15.0)
MCH: 31.6 pg (ref 26.0–34.0)
MCHC: 34.7 g/dL (ref 30.0–36.0)
MCV: 91.1 fL (ref 78.0–100.0)
PLATELETS: 129 10*3/uL — AB (ref 150–400)
RBC: 3.16 MIL/uL — ABNORMAL LOW (ref 3.87–5.11)
RDW: 14.6 % (ref 11.5–15.5)
WBC: 11.7 10*3/uL — AB (ref 4.0–10.5)

## 2016-07-15 MED ORDER — METHYLERGONOVINE MALEATE 0.2 MG PO TABS: 0.2000 mg | ORAL_TABLET | ORAL | Status: DC | PRN

## 2016-07-15 MED ORDER — METHYLERGONOVINE MALEATE 0.2 MG/ML IJ SOLN: 0.2000 mg | INTRAMUSCULAR | Status: DC | PRN

## 2016-07-15 MED ORDER — IBUPROFEN 600 MG PO TABS: 600.0000 mg | ORAL_TABLET | Freq: Four times a day (QID) | ORAL | 0 refills | Status: DC

## 2016-07-15 NOTE — Discharge Summary (Signed)
Obstetric Discharge Summary  Reason for Admission: rupture of membranes / TOLAC Prenatal Procedures: none Intrapartum Procedures: spontaneous vaginal delivery and epidural Postpartum Procedures: none Complications-Operative and Postpartum: 2nd degree perineal laceration Hemoglobin  Date Value Ref Range Status  07/15/2016 10.0 (L) 12.0 - 15.0 g/dL Final   HCT  Date Value Ref Range Status  07/15/2016 28.8 (L) 36.0 - 46.0 % Final    Physical Exam:  General: alert, cooperative and no distress Lochia: appropriate Uterine Fundus: firm Incision: healing well DVT Evaluation: No evidence of DVT seen on physical exam.  Discharge Diagnoses: Term Pregnancy-delivered and VBAC  Discharge Information: Date: 07/15/2016 Activity: pelvic rest Diet: routine Medications: PNV and Ibuprofen Condition: stable Instructions: refer to practice specific booklet Discharge to: home Follow-up Information    DAWSON, ROLITTA, M, CNM. Schedule an appointment as soon as possible for a visit in 6 week(s).   Specialty:  Obstetrics and Gynecology Contact information: [place] [address] [place] [id number] [phone]           Newborn Data: Live born female  Birth Weight: 7 lb 7.6 oz (3390 g) APGAR: 9, 9  Home with mother.  Kristina MikeBAILEY, Kristina Aguilar 07/15/2016, 7:38 PM

## 2016-07-15 NOTE — Progress Notes (Signed)
PPD # 1 SVD Information for the patient's newborn:  Maylon CosYonjof, Girl Herbert SetaHeather [[id number]][[id number]]  female    breast feeding   Baby name: Efrain Sellamerson  S:  Reports feeling well.             Tolerating po/ No nausea or vomiting             Bleeding is light             Pain controlled with PO meds             Up ad lib / ambulatory / voiding without difficulties        O:  A & O x 3, in no apparent distress              VS:  Vitals:   07/14/16 1419 07/14/16 1804 07/14/16 2230 07/15/16 0545  BP: 108/66 (!) 97/56 119/77 (!) 97/50  Pulse: 71 83 74 (!) 59  Resp: 18 18 18 18   Temp: 98.2 F (36.8 C) 99.4 F (37.4 C) 97.6 F (36.4 C) 97.9 F (36.6 C)  TempSrc: Oral Oral Oral Oral  SpO2:   98%   Weight:      Height:        LABS:  Recent Labs  07/13/16 0135 07/15/16 0547  WBC 10.9* 11.7*  HGB 12.7 10.0*  HCT 36.6 28.8*  PLT 156 129*    Blood type: --/--/O POS (09/17 0135)  Rubella: Immune (02/17 0000)   I&O: I/O last 3 completed shifts: In: -  Out: 950 [Urine:700; Blood:250]          No intake/output data recorded.  Lungs: Clear and unlabored  Heart: regular rate and rhythm / no murmurs  Abdomen: soft, non-tender, non-distended             Fundus: firm, non-tender, U-1  Perineum: repair intact, mild edema  Lochia: small  Extremities: no edema, no calf pain or tenderness    A/P: PPD # 1 25 y.o., [id number]   Principal Problem:   Postpartum care following vbac delivery (9/18) Active Problems:   VBAC, delivered, current hospitalization   Doing well - stable status  Routine post partum orders  Anticipate discharge tomorrow    Neta Mendsaniela C Paul, MSN, CNM 07/15/2016, 9:45 AM

## 2016-07-15 NOTE — Lactation Note (Signed)
This note was copied from a baby's chart. Lactation Consultation Note  Patient Name: Kristina Aguilar MVHQI'OToday's Date: 07/15/2016 Reason for consult: Infant weight loss (2% weight loss )  Baby is 2131 hours old and has been consistent at the breast since birth. Per mom breast feeding well , denies sore nipples. Baby latched when LC walked in the room, noted shallow latch due to arm  Placement. Baby briefly released , nipple well rounded and LC showed mom how to  Obtain depth, multiply swallows noted, increased with breast compressions. Sore nipple and engorgement prevention and tx. Reviewed.  Mother informed of post-discharge support and given phone number to the lactation  department, including services for phone call assistance; out-patient appointments; and  breastfeeding support group. List of other breastfeeding resources in the community given  in the handout. Encouraged mother to call for problems or concerns related to breastfeeding.    Maternal Data Has patient been taught Hand Expression?: Yes Does the patient have breastfeeding experience prior to this delivery?: Yes  Feeding Feeding Type: Breast Fed Length of feed: 30 min (observed latch with LC preceptor)  LATCH Score/Interventions Latch: Grasps breast easily, tongue down, lips flanged, rhythmical sucking.  Audible Swallowing: Spontaneous and intermittent  Type of Nipple: Everted at rest and after stimulation  Comfort (Breast/Nipple): Soft / non-tender     Hold (Positioning): Assistance needed to correctly position infant at breast and maintain latch.  LATCH Score: 9  Lactation Tools Discussed/Used WIC Program: No   Consult Status Consult Status: Complete Date: 07/15/16    [name], Shanya Ferriss Ann 07/15/2016, 3:46 PM

## 2016-12-22 ENCOUNTER — Ambulatory Visit: Payer: 59 | Admitting: Family Medicine

## 2016-12-22 DIAGNOSIS — Z0289 Encounter for other administrative examinations: Secondary | ICD-10-CM

## 2017-03-30 ENCOUNTER — Ambulatory Visit (INDEPENDENT_AMBULATORY_CARE_PROVIDER_SITE_OTHER): Payer: PRIVATE HEALTH INSURANCE | Admitting: Family Medicine

## 2017-03-30 ENCOUNTER — Encounter: Payer: Self-pay | Admitting: Family Medicine

## 2017-03-30 VITALS — BP 100/72 | HR 70 | Temp 98.4°F | Ht 72.0 in | Wt 135.0 lb

## 2017-03-30 DIAGNOSIS — M542 Cervicalgia: Secondary | ICD-10-CM | POA: Diagnosis not present

## 2017-03-30 DIAGNOSIS — Z7689 Persons encountering health services in other specified circumstances: Secondary | ICD-10-CM

## 2017-03-30 DIAGNOSIS — M545 Low back pain: Secondary | ICD-10-CM

## 2017-03-30 NOTE — Patient Instructions (Addendum)
It was a pleasure to see you today! Please follow up for a physical and lab work in the next 3 months or sooner if needed.   Neck Exercises Neck exercises can be important for many reasons:  They can help you to improve and maintain flexibility in your neck. This can be especially important as you age.  They can help to make your neck stronger. This can make movement easier.  They can reduce or prevent neck pain.  They may help your upper back.  Ask your health care provider which neck exercises would be best for you. Exercises Neck Press Repeat this exercise 10 times. Do it first thing in the morning and right before bed or as told by your health care provider. 1. Lie on your back on a firm bed or on the floor with a pillow under your head. 2. Use your neck muscles to push your head down on the pillow and straighten your spine. 3. Hold the position as well as you can. Keep your head facing up and your chin tucked. 4. Slowly count to 5 while holding this position. 5. Relax for a few seconds. Then repeat.  Isometric Strengthening Do a full set of these exercises 2 times a day or as told by your health care provider. 1. Sit in a supportive chair and place your hand on your forehead. 2. Push forward with your head and neck while pushing back with your hand. Hold for 10 seconds. 3. Relax. Then repeat the exercise 3 times. 4. Next, do thesequence again, this time putting your hand against the back of your head. Use your head and neck to push backward against the hand pressure. 5. Finally, do the same exercise on either side of your head, pushing sideways against the pressure of your hand.  Prone Head Lifts Repeat this exercise 5 times. Do this 2 times a day or as told by your health care provider. 1. Lie face-down, resting on your elbows so that your chest and upper back are raised. 2. Start with your head facing downward, near your chest. Position your chin either on or near your  chest. 3. Slowly lift your head upward. Lift until you are looking straight ahead. Then continue lifting your head as far back as you can stretch. 4. Hold your head up for 5 seconds. Then slowly lower it to your starting position.  Supine Head Lifts Repeat this exercise 8-10 times. Do this 2 times a day or as told by your health care provider. 1. Lie on your back, bending your knees to point to the ceiling and keeping your feet flat on the floor. 2. Lift your head slowly off the floor, raising your chin toward your chest. 3. Hold for 5 seconds. 4. Relax and repeat.  Scapular Retraction Repeat this exercise 5 times. Do this 2 times a day or as told by your health care provider. 1. Stand with your arms at your sides. Look straight ahead. 2. Slowly pull both shoulders backward and downward until you feel a stretch between your shoulder blades in your upper back. 3. Hold for 10-30 seconds. 4. Relax and repeat.  Contact a health care provider if:  Your neck pain or discomfort gets much worse when you do an exercise.  Your neck pain or discomfort does not improve within 2 hours after you exercise. If you have any of these problems, stop exercising right away. Do not do the exercises again unless your health care provider says that you  can. Get help right away if:  You develop sudden, severe neck pain. If this happens, stop exercising right away. Do not do the exercises again unless your health care provider says that you can. Exercises Neck Stretch  Repeat this exercise 3-5 times. 1. Do this exercise while standing or while sitting in a chair. 2. Place your feet flat on the floor, shoulder-width apart. 3. Slowly turn your head to the right. Turn it all the way to the right so you can look over your right shoulder. Do not tilt or tip your head. 4. Hold this position for 10-30 seconds. 5. Slowly turn your head to the left, to look over your left shoulder. 6. Hold this position for 10-30  seconds.  Neck Retraction Repeat this exercise 8-10 times. Do this 3-4 times a day or as told by your health care provider. 1. Do this exercise while standing or while sitting in a sturdy chair. 2. Look straight ahead. Do not bend your neck. 3. Use your fingers to push your chin backward. Do not bend your neck for this movement. Continue to face straight ahead. If you are doing the exercise properly, you will feel a slight sensation in your throat and a stretch at the back of your neck. 4. Hold the stretch for 1-2 seconds. Relax and repeat.  This information is not intended to replace advice given to you by your health care provider. Make sure you discuss any questions you have with your health care provider. Document Released: 09/24/2015 Document Revised: 03/20/2016 Document Reviewed: 04/23/2015 Elsevier Interactive Patient Education  2017 [employer].  Back Exercises If you have pain in your back, do these exercises 2-3 times each day or as told by your doctor. When the pain goes away, do the exercises once each day, but repeat the steps more times for each exercise (do more repetitions). If you do not have pain in your back, do these exercises once each day or as told by your doctor. Exercises Single Knee to Chest  Do these steps 3-5 times in a row for each leg: 1. Lie on your back on a firm bed or the floor with your legs stretched out. 2. Bring one knee to your chest. 3. Hold your knee to your chest by grabbing your knee or thigh. 4. Pull on your knee until you feel a gentle stretch in your lower back. 5. Keep doing the stretch for 10-30 seconds. 6. Slowly let go of your leg and straighten it.  Pelvic Tilt  Do these steps 5-10 times in a row: 1. Lie on your back on a firm bed or the floor with your legs stretched out. 2. Bend your knees so they point up to the ceiling. Your feet should be flat on the floor. 3. Tighten your lower belly (abdomen) muscles to press your lower back  against the floor. This will make your tailbone point up to the ceiling instead of pointing down to your feet or the floor. 4. Stay in this position for 5-10 seconds while you gently tighten your muscles and breathe evenly.  Cat-Cow  Do these steps until your lower back bends more easily: 1. Get on your hands and knees on a firm surface. Keep your hands under your shoulders, and keep your knees under your hips. You may put padding under your knees. 2. Let your head hang down, and make your tailbone point down to the floor so your lower back is round like the back of a cat.  3. Stay in this position for 5 seconds. 4. Slowly lift your head and make your tailbone point up to the ceiling so your back hangs low (sags) like the back of a cow. 5. Stay in this position for 5 seconds.  Press-Ups  Do these steps 5-10 times in a row: 1. Lie on your belly (face-down) on the floor. 2. Place your hands near your head, about shoulder-width apart. 3. While you keep your back relaxed and keep your hips on the floor, slowly straighten your arms to raise the top half of your body and lift your shoulders. Do not use your back muscles. To make yourself more comfortable, you may change where you place your hands. 4. Stay in this position for 5 seconds. 5. Slowly return to lying flat on the floor.  Bridges  Do these steps 10 times in a row: 1. Lie on your back on a firm surface. 2. Bend your knees so they point up to the ceiling. Your feet should be flat on the floor. 3. Tighten your butt muscles and lift your butt off of the floor until your waist is almost as high as your knees. If you do not feel the muscles working in your butt and the back of your thighs, slide your feet 1-2 inches farther away from your butt. 4. Stay in this position for 3-5 seconds. 5. Slowly lower your butt to the floor, and let your butt muscles relax.  If this exercise is too easy, try doing it with your arms crossed over your  chest. Belly Crunches  Do these steps 5-10 times in a row: 1. Lie on your back on a firm bed or the floor with your legs stretched out. 2. Bend your knees so they point up to the ceiling. Your feet should be flat on the floor. 3. Cross your arms over your chest. 4. Tip your chin a little bit toward your chest but do not bend your neck. 5. Tighten your belly muscles and slowly raise your chest just enough to lift your shoulder blades a tiny bit off of the floor. 6. Slowly lower your chest and your head to the floor.  Back Lifts Do these steps 5-10 times in a row: 1. Lie on your belly (face-down) with your arms at your sides, and rest your forehead on the floor. 2. Tighten the muscles in your legs and your butt. 3. Slowly lift your chest off of the floor while you keep your hips on the floor. Keep the back of your head in line with the curve in your back. Look at the floor while you do this. 4. Stay in this position for 3-5 seconds. 5. Slowly lower your chest and your face to the floor.  Contact a doctor if:  Your back pain gets a lot worse when you do an exercise.  Your back pain does not lessen 2 hours after you exercise. If you have any of these problems, stop doing the exercises. Do not do them again unless your doctor says it is okay. Get help right away if:  You have sudden, very bad back pain. If this happens, stop doing the exercises. Do not do them again unless your doctor says it is okay. This information is not intended to replace advice given to you by your health care provider. Make sure you discuss any questions you have with your health care provider. Document Released: 11/15/2010 Document Revised: 03/20/2016 Document Reviewed: 12/07/2014 Elsevier Interactive Patient Education  2018 Elsevier  Inc.   WE NOW OFFER   [facility] FAST TRACK!!!  SAME DAY Appointments for ACUTE CARE  Such as: Sprains, Injuries, cuts, abrasions, rashes, muscle pain, joint pain,  back pain Colds, flu, sore throats, headache, allergies, cough, fever  Ear pain, sinus and eye infections Abdominal pain, nausea, vomiting, diarrhea, upset stomach Animal/insect bites  3 Easy Ways to Schedule: Walk-In Scheduling Call in scheduling Mychart Sign-up: [email or web address]

## 2017-03-30 NOTE — Progress Notes (Signed)
Patient ID: Kristina Aguilar, female   DOB: 04-07-1991, 26 y.o.   MRN: [id number]  Patient presents to clinic today to establish care.  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  She was diagnosed at 26 years old with a HA that was persistent.  She was evaluated by neurology as a child and there was not a reason established due to increased fluid; this was suspected to be related to a physiological difference and she has a sister with similar history.  She was evaluated in 05/2016 prior to VBAC and there was no abnormalities.  She was told that she did not need to return to neurology unless symptoms present. No further CT scans recommended by neurology at this time.   Neck and back pain that have been chronic related to scoliosis that is present. [facility] and was treated with PT which has provided great benefit to her. She reports receiving home exercises to do however has not been adherent to this as she is taking care of a 27 year old and 61 month old.  She describes pain as aching at times when she is carrying her children and states that it is intermittent in nature and is noted in her neck worse than her back.  She is not able to rate the pain at this time. She denies numbness/weakness, fever, chills, sweats, pain at night, unexplained weight loss, history of drug use or cancer. She denies radiating pain in her arms or down her legs.   No medications are needed at this time per patient.    Health Maintenance: Dental --Every 6 months Vision --Wears contacts; wears contacts Immunizations --No influenza, declined by patient; Tdap 2010 Colonoscopy --May 2014 due to blood in stool; polyp found and removed; 2016 follow up colonoscopy which was normal. Five year follow up in 2021 PAP -- Followed by gynecology; Rocco Pauls; UTD; due in 06/2017  She follows a modified heart healthy diet with lean meats and  vegetables. She does not follow a formal exercise program but has initiated walking three times/week without cardiopulmonary symptoms.   Past Medical History:  Diagnosis Date  . Anemia    hx 2011  . GERD (gastroesophageal reflux disease)   . Hydrocephalus with operating shunt    surgery 2001 and then revised 2002, Neurologist Dr Alvester Morin at [place]  . Postpartum care following cesarean delivery (8/28) 06/23/2014  . Postpartum care following vbac delivery (9/18) 07/14/2016  . Scoliosis   . VBAC, delivered, current hospitalization 07/14/2016    Past Surgical History:  Procedure Laterality Date  . CESAREAN SECTION N/A 06/23/2014   Procedure: Primary CESAREAN SECTION;  Surgeon: Robley Fries, MD;  Location: [facility];  Service: Obstetrics;  Laterality: N/A;  EDD: 06/27/14  . COLONOSCOPY    . VENTRICULOPERITONEAL SHUNT     09/2000 and revised in 03/2001  . WISDOM TOOTH EXTRACTION  2007    Current Outpatient Prescriptions on File Prior to Visit  Medication Sig Dispense Refill  . Prenatal Vit-Fe Fumarate-FA (PRENATAL MULTIVITAMIN) TABS tablet Take 1 tablet by mouth daily at 12 noon.      No current facility-administered medications on file prior to visit.     No Known Allergies  Family History  Problem Relation Age of Onset  . Asthma Mother   . Hypertension Mother   . Hypertension Father     Social History   Social History  . Marital status: Married  Spouse name: N/A  . Number of children: N/A  . Years of education: N/A   Occupational History  . Not on file.   Social History Main Topics  . Smoking status: Never Smoker  . Smokeless tobacco: Never Used  . Alcohol use No  . Drug use: No  . Sexual activity: Not on file   Other Topics Concern  . Not on file   Social History Narrative  . No narrative on file    ROS  BP 100/72 (BP Location: Left Arm, Patient Position: Sitting, Cuff Size: Normal)   Pulse 70   Temp 98.4 F (36.9 C) (Oral)   Ht 6' (1.829 m)   Wt 135 lb  (61.2 kg)   LMP 03/31/2015 (Approximate)   SpO2 98%   Breastfeeding? Yes   BMI 18.31 kg/m   Physical Exam  Constitutional: She is well-developed, well-nourished, and in no distress.  Eyes: Pupils are equal, round, and reactive to light. No scleral icterus.  Neck: Neck supple.  Cardiovascular: Normal rate, regular rhythm and intact distal pulses.   Pulmonary/Chest: Effort normal and breath sounds normal. She has no wheezes. She has no rales.  Abdominal: Soft. Bowel sounds are normal. There is no tenderness.  Musculoskeletal: She exhibits no edema.  Spine with normal alignment and no deformity. No tenderness to vertebral process with palpation.    Paraspinous muscles are not tender. ROM is full at lumbar sacral regions. Scoliosis present; Negative Straight Leg raise. No CVA tenderness present. Able to heel/toe walk without pain.  Neck: Negative spurling's Full neck range of motion Grip strength and sensation normal in bilateral hands Reflexes normal  Lymphadenopathy:    She has no cervical adenopathy.  Neurological: She is alert. Gait normal.  Skin: Skin is warm and dry. No rash noted.  Psychiatric: Mood, memory, affect and judgment normal.     Assessment/Plan: 1. Neck pain Chronic: Exam is reassuring; provided written neck exercises and patient also sees a chiropractor twice monthly. She will follow up if discomfort is not improved with exercises or she develops pain that is consistent, worsens, or she develops new symptoms. .  2. Low back pain, unspecified back pain laterality, unspecified chronicity, with sciatica presence unspecified No pain today; exam is reassuring; provided written exercises and she will continue to see a chiropractor and follow up if symptoms do not improve with exercise, worsen, or she develops new symtpoms.  3. Encounter to establish care We reviewed the PMH, PSH, FH, SH, Meds and Allergies. -We provided refills for any medications we will prescribe as  needed. -We addressed current concerns per orders and patient instructions. -We have asked for records for pertinent exams, studies, vaccines and notes from previous providers. -We have advised patient to follow up per instructions below.   -Patient advised to return or notify a provider immediately if symptoms worsen or persist or new concerns arise.  Patient will follow up in the next 3 months for a physical and lab work or sooner if neck and back pain are not improving with exercises.  Roddie McJulia Rael Yo, FNP-C

## 2017-04-13 ENCOUNTER — Encounter: Payer: Self-pay | Admitting: Family Medicine

## 2017-04-13 ENCOUNTER — Ambulatory Visit (INDEPENDENT_AMBULATORY_CARE_PROVIDER_SITE_OTHER): Payer: PRIVATE HEALTH INSURANCE | Admitting: Family Medicine

## 2017-04-13 VITALS — BP 100/80 | HR 58 | Temp 97.7°F | Ht 71.0 in | Wt 132.8 lb

## 2017-04-13 DIAGNOSIS — M545 Low back pain: Secondary | ICD-10-CM

## 2017-04-13 DIAGNOSIS — M542 Cervicalgia: Secondary | ICD-10-CM | POA: Diagnosis not present

## 2017-04-13 DIAGNOSIS — Z0000 Encounter for general adult medical examination without abnormal findings: Secondary | ICD-10-CM

## 2017-04-13 LAB — HEPATIC FUNCTION PANEL
ALK PHOS: 64 U/L (ref 39–117)
ALT: 14 U/L (ref 0–35)
AST: 18 U/L (ref 0–37)
Albumin: 4.6 g/dL (ref 3.5–5.2)
BILIRUBIN DIRECT: 0.1 mg/dL (ref 0.0–0.3)
Total Bilirubin: 0.8 mg/dL (ref 0.2–1.2)
Total Protein: 7.1 g/dL (ref 6.0–8.3)

## 2017-04-13 LAB — POCT URINALYSIS DIPSTICK
Bilirubin, UA: NEGATIVE
Blood, UA: NEGATIVE
Glucose, UA: NEGATIVE
Ketones, UA: NEGATIVE
Leukocytes, UA: NEGATIVE
Nitrite, UA: NEGATIVE
PROTEIN UA: NEGATIVE
Spec Grav, UA: 1.025 (ref 1.010–1.025)
UROBILINOGEN UA: 0.2 U/dL
pH, UA: 6 (ref 5.0–8.0)

## 2017-04-13 LAB — CBC WITH DIFFERENTIAL/PLATELET
BASOS PCT: 1.2 % (ref 0.0–3.0)
Basophils Absolute: 0.1 10*3/uL (ref 0.0–0.1)
EOS PCT: 2.7 % (ref 0.0–5.0)
Eosinophils Absolute: 0.1 10*3/uL (ref 0.0–0.7)
HCT: 40.7 % (ref 36.0–46.0)
Hemoglobin: 13.8 g/dL (ref 12.0–15.0)
LYMPHS ABS: 1.9 10*3/uL (ref 0.7–4.0)
Lymphocytes Relative: 39.9 % (ref 12.0–46.0)
MCHC: 34 g/dL (ref 30.0–36.0)
MCV: 87.5 fl (ref 78.0–100.0)
MONO ABS: 0.4 10*3/uL (ref 0.1–1.0)
MONOS PCT: 7.8 % (ref 3.0–12.0)
NEUTROS PCT: 48.4 % (ref 43.0–77.0)
Neutro Abs: 2.3 10*3/uL (ref 1.4–7.7)
PLATELETS: 176 10*3/uL (ref 150.0–400.0)
RBC: 4.65 Mil/uL (ref 3.87–5.11)
RDW: 13 % (ref 11.5–15.5)
WBC: 4.8 10*3/uL (ref 4.0–10.5)

## 2017-04-13 LAB — LIPID PANEL
Cholesterol: 166 mg/dL (ref 0–200)
HDL: 58.9 mg/dL (ref >39.00)
LDL Cholesterol: 96 mg/dL (ref 0–99)
NonHDL: 106.68
TRIGLYCERIDES: 51 mg/dL (ref 0.0–149.0)
Total CHOL/HDL Ratio: 3
VLDL: 10.2 mg/dL (ref 0.0–40.0)

## 2017-04-13 LAB — BASIC METABOLIC PANEL
BUN: 15 mg/dL (ref 6–23)
CALCIUM: 9.9 mg/dL (ref 8.4–10.5)
CO2: 29 mEq/L (ref 19–32)
CREATININE: 0.76 mg/dL (ref 0.40–1.20)
Chloride: 104 mEq/L (ref 96–112)
GFR: 96.89 mL/min (ref >60.00)
Glucose, Bld: 88 mg/dL (ref 70–99)
Potassium: 4.2 mEq/L (ref 3.5–5.1)
SODIUM: 138 meq/L (ref 135–145)

## 2017-04-13 NOTE — Progress Notes (Signed)
Subjective:    Patient ID: Kristina Aguilar, female    DOB: 1991/10/05, 26 y.o.   MRN: [id number]  HPI  [name] is a 26 year old female who is here today for routine physical examination. She is followed by gynecology for women's health and is due for this care 06/2017.  Diet: Modified heart healthy diet and reports increased water intake as she is breastfeeding. She denies caffeine intake; milk and juice are mainstays of hydration for this patient. Exercise: No formal exercise program; she has recently initiated exercises suggested by PT previously for pain and also initiated exercises provided at last appointment in this office for one week.  Chronic conditions of neck and back pain related to scoliosis. [facility] and completed PT with recommended home exercises.  She states that she has not adhered to this exercise program as she has been busy taking care of her 27 year old and 82 month old.  She has recently started exercises again and added exercises provided to her at her last visit in this office which have provided excellent benefit.   Review of Systems Constitutional: No fever, chills, significant weight change, fatigue, weakness or night sweats Eyes: No redness, discharge, pain, blurred vision, double vision, or loss of vision ENT/mouth: No nasal congestion, postnasal drainage,epistaxis, purulent discharge, earache, hearing loss, tinnitus ,sore throat , dental pain, or hoarseness   Cardiovascular: no chest pain, palpitations, racing, irregular rhythm, syncope, nausea, sweating, claudication, or edema  Respiratory: No cough, sputum production,hemoptysis,  dyspnea, paroxysmal nocturnal dyspnea, pleuritic chest pain, significant snoring, or  apnea    Gastrointestinal: No heartburn,dysphagia, nausea and vomiting,ominal pain, change in bowels, anorexia, diarrhea, significant constipation, rectal bleeding, melena,  stool incontinence or  jaundice Genitourinary: No dysuria,hematuria, pyuria, frequency, urgency,  incontinence, nocturia, dark urine or flank pain Musculoskeletal: No myalgias or muscle cramping, joint stiffness, joint swelling, joint color change, weakness, or cyanosis Dermatologic: No rash, pruritus, urticaria, or change in color or temperature of skin Neurologic: No headache, vertigo, limb weakness, tremor, gait disturbance, seizures, memory loss, numbness or tingling Psychiatric: No significant anxiety or depression, anhedonia, panic attacks, insomnia, or anorexia Endocrine: No change in hair/skin/ nails, excessive thirst, excessive hunger, excessive urination, or unexplained fatigue Hematologic/lymphatic: No bruising, lymphadenopathy,or  abnormal clotting Allergy/immunology: No itchy/ watery eyes, abnormal sneezing, rhinitis, urticaria ,or angioedema  Past Medical History:  Diagnosis Date  . Anemia    hx 2011  . GERD (gastroesophageal reflux disease)   . Hydrocephalus with operating shunt    surgery 2001 and then revised 2002, Neurologist Dr Alvester Morin at [place]  . Postpartum care following cesarean delivery (8/28) 06/23/2014  . Postpartum care following vbac delivery (9/18) 07/14/2016  . Scoliosis   . VBAC, delivered, current hospitalization 07/14/2016     Social History   Social History  . Marital status: Married    Spouse name: N/A  . Number of children: N/A  . Years of education: N/A   Occupational History  . Not on file.   Social History Main Topics  . Smoking status: Never Smoker  . Smokeless tobacco: Never Used  . Alcohol use No  . Drug use: No  . Sexual activity: Not on file   Other Topics Concern  . Not on file   Social History Narrative  . No narrative on file    Past Surgical History:  Procedure Laterality Date  . CESAREAN SECTION N/A 06/23/2014   Procedure: Primary CESAREAN SECTION;  Surgeon:  Robley FriesVaishali R Mody, MD;  Location: [facility];  Service: Obstetrics;  Laterality: N/A;  EDD:  06/27/14  . COLONOSCOPY    . VENTRICULOPERITONEAL SHUNT     09/2000 and revised in 03/2001  . WISDOM TOOTH EXTRACTION  2007    Family History  Problem Relation Age of Onset  . Asthma Mother   . Hypertension Mother   . Heart disease Mother   . Hyperlipidemia Mother   . Hypertension Father   . Hyperlipidemia Father   . Pseudotumor cerebri Sister     No Known Allergies  Current Outpatient Prescriptions on File Prior to Visit  Medication Sig Dispense Refill  . Prenatal Vit-Fe Fumarate-FA (PRENATAL MULTIVITAMIN) TABS tablet Take 1 tablet by mouth daily at 12 noon.      No current facility-administered medications on file prior to visit.     BP 100/80 (BP Location: Left Arm, Patient Position: Sitting, Cuff Size: Normal)   Pulse (!) 58   Temp 97.7 F (36.5 C) (Oral)   Ht 5\' 11"  (1.803 m)   Wt 132 lb 12.8 oz (60.2 kg)   LMP 09/14/2015 (Approximate)   SpO2 98%   BMI 18.52 kg/m       Objective:   Physical Exam  Physical Exam  Constitutional: She is oriented to person, place, and time. She appears well-developed and well-nourished. No distress.  HENT:  Head: Normocephalic and atraumatic.  Right Ear: Tympanic membrane and ear canal normal.  Left Ear: Tympanic membrane and ear canal normal.  Mouth/Throat: Oropharynx is clear and moist.  Eyes: Pupils are equal, round, and reactive to light. No scleral icterus.  Neck: Normal range of motion. No thyromegaly present.  Cardiovascular: Normal rate and regular rhythm.   No murmur heard. Pulmonary/Chest: Effort normal and breath sounds normal. No respiratory distress. He has no wheezes. She has no rales. She exhibits no tenderness.  Abdominal: Soft. Bowel sounds are normal. She exhibits no distension and no mass. There is no tenderness. There is no rebound and no guarding.  Musculoskeletal: She exhibits no edema.  Lymphadenopathy:    She has no cervical adenopathy.  Neurological: She is alert and oriented to person, place, and  time. She has normal patellar reflexes. She exhibits normal muscle tone. Coordination normal.  Skin: Skin is warm and dry.  Psychiatric: She has a normal mood and affect. Her behavior is normal. Judgment and thought content normal.  Breast and pelvic exam: Followed by gynecology and will follow up 06/2017     Assessment & Plan:  1. Routine general medical examination at a health care facility 26 y.o. female presenting for annual physical.  Health Maintenance counseling: 1. Anticipatory guidance: Patient counseled regarding regular dental exams, eye exams, wearing seatbelts.  2. Risk factor reduction:  Advised patient of need for regular exercise and diet rich and fruits and vegetables to reduce risk of heart attack and stroke.  3. Immunizations/screenings/ancillary studies There is no immunization history for the selected administration types on file for this patient.:  Td due 2020: Pap due: 06/2017 where she is followed by [facility]. Health Maintenance Due  Topic Date Due  . TETANUS/TDAP  02/03/2009  . PAP SMEAR  02/04/2011   4. Cervical cancer screening- Followed by gynecology:  Due for follow up 06/2017 5. Breast cancer screening-  Followed by gynecology 6. Colon cancer screening - She has a follow up in 2021 after a colonoscopy in 2016 which was normal; early screening due to blood in stool in 2014 and polyp  removed. 7. Skin cancer screening- No suspicious lesions noted; wears sunscreen daily. Discussed ABCDEs of screening for lesions.    2. Neck pain Improved with adherence to exercises. Will continue exercises and follow up in 4 to 6 weeks if needed.  3. Low back pain, unspecified back pain laterality, unspecified chronicity, with sciatica presence unspecified Improved with adherence to exercises.  Will continue exercises and follow up in 4 to 6 weeks if needed. - CBC with Differential/Platelet - Basic metabolic panel - Hepatic function panel - Lipid panel - POCT urinalysis  dipstick   Follow up in one year or sooner if needed.  Roddie Mc, FNP-C

## 2017-04-13 NOTE — Patient Instructions (Signed)
It was a pleasure to see you today!   We have ordered labs or studies at this visit. It can take up to 1-2 weeks for results and processing. IF results require follow up or explanation, we will call you with instructions. Clinically stable results will be released to your [facility]. If you have not heard from [place] or cannot find your results in [facility] in 2 weeks please contact our office at [phone].  If you are not yet signed up for [facility], please consider signing up   Health Maintenance, Female Adopting a healthy lifestyle and getting preventive care can go a long way to promote health and wellness. Talk with your health care provider about what schedule of regular examinations is right for you. This is a good chance for you to check in with your provider about disease prevention and staying healthy. In between checkups, there are plenty of things you can do on your own. Experts have done a lot of research about which lifestyle changes and preventive measures are most likely to keep you healthy. Ask your health care provider for more information. Weight and diet Eat a healthy diet  Be sure to include plenty of vegetables, fruits, low-fat dairy products, and lean protein.  Do not eat a lot of foods high in solid fats, added sugars, or salt.  Get regular exercise. This is one of the most important things you can do for your health. ? Most adults should exercise for at least 150 minutes each week. The exercise should increase your heart rate and make you sweat (moderate-intensity exercise). ? Most adults should also do strengthening exercises at least twice a week. This is in addition to the moderate-intensity exercise.  Maintain a healthy weight  Body mass index (BMI) is a measurement that can be used to identify possible weight problems. It estimates body fat based on height and weight. Your health care provider can help determine your BMI and help you achieve or maintain a healthy  weight.  For females 15 years of age and older: ? A BMI below 18.5 is considered underweight. ? A BMI of 18.5 to 24.9 is normal. ? A BMI of 25 to 29.9 is considered overweight. ? A BMI of 30 and above is considered obese.  Watch levels of cholesterol and blood lipids  You should start having your blood tested for lipids and cholesterol at 27 years of age, then have this test every 5 years.  You may need to have your cholesterol levels checked more often if: ? Your lipid or cholesterol levels are high. ? You are older than 27 years of age. ? You are at high risk for heart disease.  Cancer screening Lung Cancer  Lung cancer screening is recommended for adults 52-32 years old who are at high risk for lung cancer because of a history of smoking.  A yearly low-dose CT scan of the lungs is recommended for people who: ? Currently smoke. ? Have quit within the past 15 years. ? Have at least a 30-pack-year history of smoking. A pack year is smoking an average of one pack of cigarettes a day for 1 year.  Yearly screening should continue until it has been 15 years since you quit.  Yearly screening should stop if you develop a health problem that would prevent you from having lung cancer treatment.  Breast Cancer  Practice breast self-awareness. This means understanding how your breasts normally appear and feel.  It also means doing regular breast self-exams. Let  your health care provider know about any changes, no matter how small.  If you are in your 20s or 30s, you should have a clinical breast exam (CBE) by a health care provider every 1-3 years as part of a regular health exam.  If you are 11 or older, have a CBE every year. Also consider having a breast X-ray (mammogram) every year.  If you have a family history of breast cancer, talk to your health care provider about genetic screening.  If you are at high risk for breast cancer, talk to your health care provider about having an  MRI and a mammogram every year.  Breast cancer gene (BRCA) assessment is recommended for women who have family members with BRCA-related cancers. BRCA-related cancers include: ? Breast. ? Ovarian. ? Tubal. ? Peritoneal cancers.  Results of the assessment will determine the need for genetic counseling and BRCA1 and BRCA2 testing.  Cervical Cancer Your health care provider may recommend that you be screened regularly for cancer of the pelvic organs (ovaries, uterus, and vagina). This screening involves a pelvic examination, including checking for microscopic changes to the surface of your cervix (Pap test). You may be encouraged to have this screening done every 3 years, beginning at age 19.  For women ages 70-65, health care providers may recommend pelvic exams and Pap testing every 3 years, or they may recommend the Pap and pelvic exam, combined with testing for human papilloma virus (HPV), every 5 years. Some types of HPV increase your risk of cervical cancer. Testing for HPV may also be done on women of any age with unclear Pap test results.  Other health care providers may not recommend any screening for nonpregnant women who are considered low risk for pelvic cancer and who do not have symptoms. Ask your health care provider if a screening pelvic exam is right for you.  If you have had past treatment for cervical cancer or a condition that could lead to cancer, you need Pap tests and screening for cancer for at least 20 years after your treatment. If Pap tests have been discontinued, your risk factors (such as having a new sexual partner) need to be reassessed to determine if screening should resume. Some women have medical problems that increase the chance of getting cervical cancer. In these cases, your health care provider may recommend more frequent screening and Pap tests.  Colorectal Cancer  This type of cancer can be detected and often prevented.  Routine colorectal cancer screening  usually begins at 27 years of age and continues through 27 years of age.  Your health care provider may recommend screening at an earlier age if you have risk factors for colon cancer.  Your health care provider may also recommend using home test kits to check for hidden blood in the stool.  A small camera at the end of a tube can be used to examine your colon directly (sigmoidoscopy or colonoscopy). This is done to check for the earliest forms of colorectal cancer.  Routine screening usually begins at age 12.  Direct examination of the colon should be repeated every 5-10 years through 27 years of age. However, you may need to be screened more often if early forms of precancerous polyps or small growths are found.  Skin Cancer  Check your skin from head to toe regularly.  Tell your health care provider about any new moles or changes in moles, especially if there is a change in a mole's shape or color.  Also tell your health care provider if you have a mole that is larger than the size of a pencil eraser.  Always use sunscreen. Apply sunscreen liberally and repeatedly throughout the day.  Protect yourself by wearing long sleeves, pants, a wide-brimmed hat, and sunglasses whenever you are outside.  Heart disease, diabetes, and high blood pressure  High blood pressure causes heart disease and increases the risk of stroke. High blood pressure is more likely to develop in: ? People who have blood pressure in the high end of the normal range (130-139/85-89 mm Hg). ? People who are overweight or obese. ? People who are African American.  If you are 3-12 years of age, have your blood pressure checked every 3-5 years. If you are 108 years of age or older, have your blood pressure checked every year. You should have your blood pressure measured twice-once when you are at a hospital or clinic, and once when you are not at a hospital or clinic. Record the average of the two measurements. To check  your blood pressure when you are not at a hospital or clinic, you can use: ? An automated blood pressure machine at a pharmacy. ? A home blood pressure monitor.  If you are between 48 years and 26 years old, ask your health care provider if you should take aspirin to prevent strokes.  Have regular diabetes screenings. This involves taking a blood sample to check your fasting blood sugar level. ? If you are at a normal weight and have a low risk for diabetes, have this test once every three years after 27 years of age. ? If you are overweight and have a high risk for diabetes, consider being tested at a younger age or more often. Preventing infection Hepatitis B  If you have a higher risk for hepatitis B, you should be screened for this virus. You are considered at high risk for hepatitis B if: ? You were born in a country where hepatitis B is common. Ask your health care provider which countries are considered high risk. ? Your parents were born in a high-risk country, and you have not been immunized against hepatitis B (hepatitis B vaccine). ? You have HIV or AIDS. ? You use needles to inject street drugs. ? You live with someone who has hepatitis B. ? You have had sex with someone who has hepatitis B. ? You get hemodialysis treatment. ? You take certain medicines for conditions, including cancer, organ transplantation, and autoimmune conditions.  Hepatitis C  Blood testing is recommended for: ? Everyone born from [birth year] through 1966. ? Anyone with known risk factors for hepatitis C.  Sexually transmitted infections (STIs)  You should be screened for sexually transmitted infections (STIs) including gonorrhea and chlamydia if: ? You are sexually active and are younger than 27 years of age. ? You are older than 27 years of age and your health care provider tells you that you are at risk for this type of infection. ? Your sexual activity has changed since you were last screened and you  are at an increased risk for chlamydia or gonorrhea. Ask your health care provider if you are at risk.  If you do not have HIV, but are at risk, it may be recommended that you take a prescription medicine daily to prevent HIV infection. This is called pre-exposure prophylaxis (PrEP). You are considered at risk if: ? You are sexually active and do not regularly use condoms or know the HIV status of  your partner(s). ? You take drugs by injection. ? You are sexually active with a partner who has HIV.  Talk with your health care provider about whether you are at high risk of being infected with HIV. If you choose to begin PrEP, you should first be tested for HIV. You should then be tested every 3 months for as long as you are taking PrEP. Pregnancy  If you are premenopausal and you may become pregnant, ask your health care provider about preconception counseling.  If you may become pregnant, take 400 to 800 micrograms (mcg) of folic acid every day.  If you want to prevent pregnancy, talk to your health care provider about birth control (contraception). Osteoporosis and menopause  Osteoporosis is a disease in which the bones lose minerals and strength with aging. This can result in serious bone fractures. Your risk for osteoporosis can be identified using a bone density scan.  If you are 42 years of age or older, or if you are at risk for osteoporosis and fractures, ask your health care provider if you should be screened.  Ask your health care provider whether you should take a calcium or vitamin D supplement to lower your risk for osteoporosis.  Menopause may have certain physical symptoms and risks.  Hormone replacement therapy may reduce some of these symptoms and risks. Talk to your health care provider about whether hormone replacement therapy is right for you. Follow these instructions at home:  Schedule regular health, dental, and eye exams.  Stay current with your  immunizations.  Do not use any tobacco products including cigarettes, chewing tobacco, or electronic cigarettes.  If you are pregnant, do not drink alcohol.  If you are breastfeeding, limit how much and how often you drink alcohol.  Limit alcohol intake to no more than 1 drink per day for nonpregnant women. One drink equals 12 ounces of beer, 5 ounces of wine, or 1 ounces of hard liquor.  Do not use street drugs.  Do not share needles.  Ask your health care provider for help if you need support or information about quitting drugs.  Tell your health care provider if you often feel depressed.  Tell your health care provider if you have ever been abused or do not feel safe at home. This information is not intended to replace advice given to you by your health care provider. Make sure you discuss any questions you have with your health care provider. Document Released: 04/28/2011 Document Revised: 03/20/2016 Document Reviewed: 07/17/2015 Elsevier Interactive Patient Education  [employer].

## 2017-05-07 ENCOUNTER — Encounter: Payer: PRIVATE HEALTH INSURANCE | Admitting: Family Medicine

## 2017-10-12 ENCOUNTER — Ambulatory Visit: Payer: PRIVATE HEALTH INSURANCE | Admitting: Family Medicine

## 2017-10-12 ENCOUNTER — Encounter: Payer: Self-pay | Admitting: Family Medicine

## 2017-10-12 VITALS — BP 110/72 | HR 80 | Temp 99.0°F | Resp 16 | Ht 71.0 in | Wt 132.5 lb

## 2017-10-12 DIAGNOSIS — J069 Acute upper respiratory infection, unspecified: Secondary | ICD-10-CM

## 2017-10-12 DIAGNOSIS — Z20828 Contact with and (suspected) exposure to other viral communicable diseases: Secondary | ICD-10-CM

## 2017-10-12 DIAGNOSIS — J029 Acute pharyngitis, unspecified: Secondary | ICD-10-CM

## 2017-10-12 LAB — POC INFLUENZA A&B (BINAX/QUICKVUE)
INFLUENZA A, POC: NEGATIVE
Influenza B, POC: NEGATIVE

## 2017-10-12 LAB — POCT RAPID STREP A (OFFICE): Rapid Strep A Screen: NEGATIVE

## 2017-10-12 MED ORDER — OSELTAMIVIR PHOSPHATE 75 MG PO CAPS: 75.0000 mg | ORAL_CAPSULE | Freq: Two times a day (BID) | ORAL | 0 refills | Status: AC

## 2017-10-12 NOTE — Patient Instructions (Addendum)
  Ms.Arlin Cranston NeighborElizabeth Deford I have seen you today for an acute visit.  A few things to remember from today's visit:   Sore throat - Plan: POCT rapid strep A  Fever and chills - Plan: POC Influenza A&B (Binax test)  Exposure to the flu - Plan: oseltamivir (TAMIFLU) 75 MG capsule     Over the counter medications as decongestants and cold medications usually help, they need to be taken with caution if there is a history of high blood pressure or palpitations. Tylenol and/or Ibuprofen also helps with most symptoms (headache, muscle aching, fever,etc) Plenty of fluids. Honey helps with cough. Steam inhalations helps with runny nose, nasal congestion, and may prevent sinus infections. Cough and nasal congestion may develop and could last a few days and sometimes weeks. Please follow in not any better in 1-2 weeks or if symptoms get worse.  Monitor for new symptoms.  In general please monitor for signs of worsening symptoms and seek immediate medical attention if any concerning.  If symptoms are not resolved in 1-2 weeks you should schedule a follow up appointment with your doctor, before if needed.  I hope you get better soon!

## 2017-10-12 NOTE — Progress Notes (Signed)
ACUTE VISIT  HPI:  Chief Complaint  Patient presents with  . Fever    started yesterday, daughter dx with flu on yesterday  . Sore Throat  . Generalized Body Aches  . Fatigue  . Chills    [name] is a 26 y.o.female here today complaining of fever that started last night.  Last night temp 100.3 F "A little bit" of non productive cough. Mild ear pressure sensation, L>R.  Fever   This is a new problem. The current episode started yesterday. The problem occurs intermittently. The problem has been unchanged. The maximum temperature noted was 100 to 100.9 F. The temperature was taken using an oral thermometer. Associated symptoms include congestion, coughing, ear pain, muscle aches and a sore throat. Pertinent negatives include no abdominal pain, diarrhea, headaches, nausea, rash, urinary pain, vomiting or wheezing. She has tried acetaminophen for the symptoms. The treatment provided mild relief.  Risk factors: sick contacts   Risk factors: no contaminated water, no immunosuppression, no recent sickness and no recent travel     No Hx of recent travel. +Sick contact, her 591 yo daughter was Dx with Influenza yesterday. No known insect bite.   Review of Systems  Constitutional: Positive for activity change, appetite change, chills, fatigue and fever.  HENT: Positive for congestion, ear pain, postnasal drip, rhinorrhea and sore throat. Negative for mouth sores, sinus pressure, sneezing, trouble swallowing and voice change.   Eyes: Negative for discharge, redness and itching.  Respiratory: Positive for cough. Negative for shortness of breath and wheezing.   Gastrointestinal: Negative for abdominal pain, diarrhea, nausea and vomiting.  Genitourinary: Negative for dysuria, frequency and hematuria.  Musculoskeletal: Negative for back pain, joint swelling, myalgias and neck pain.  Skin: Negative for rash.  Allergic/Immunologic: Negative for environmental  allergies.  Neurological: Negative for weakness and headaches.  Hematological: Negative for adenopathy. Does not bruise/bleed easily.      Current Outpatient Medications on File Prior to Visit  Medication Sig Dispense Refill  . Prenatal Vit-Fe Fumarate-FA (PRENATAL MULTIVITAMIN) TABS tablet Take 1 tablet by mouth daily at 12 noon.      No current facility-administered medications on file prior to visit.      Past Medical History:  Diagnosis Date  . Anemia    hx 2011  . GERD (gastroesophageal reflux disease)   . Hydrocephalus with operating shunt    surgery 2001 and then revised 2002, Neurologist [facility] at [facility]  . Postpartum care following cesarean delivery (8/28) 06/23/2014  . Postpartum care following vbac delivery (9/18) 07/14/2016  . Scoliosis   . VBAC, delivered, current hospitalization 07/14/2016   No Known Allergies  Social History   Socioeconomic History  . Marital status: Married    Spouse name: None  . Number of children: None  . Years of education: None  . Highest education level: None  Social Needs  . Financial resource strain: None  . Food insecurity - worry: None  . Food insecurity - inability: None  . Transportation needs - medical: None  . Transportation needs - non-medical: None  Occupational History  . None  Tobacco Use  . Smoking status: Never Smoker  . Smokeless tobacco: Never Used  Substance and Sexual Activity  . Alcohol use: No  . Drug use: No  . Sexual activity: None  Other Topics Concern  . None  Social History Narrative  . None    Vitals:   10/12/17 1619  BP: 110/72  Pulse:  80  Resp: 16  Temp: 99 F (37.2 C)  SpO2: 99%   Body mass index is 18.48 kg/m.   Physical Exam  Nursing note and vitals reviewed. Constitutional: She is oriented to person, place, and time. She appears well-developed. She does not appear ill. No distress.  HENT:  Head: Normocephalic and atraumatic.  Right Ear: Tympanic membrane, external ear and  ear canal normal.  Left Ear: Tympanic membrane, external ear and ear canal normal.  Nose: Right sinus exhibits no maxillary sinus tenderness and no frontal sinus tenderness. Left sinus exhibits no maxillary sinus tenderness and no frontal sinus tenderness.  Mouth/Throat: Uvula is midline and mucous membranes are normal. Posterior oropharyngeal erythema (Mild) present. No oropharyngeal exudate, posterior oropharyngeal edema or tonsillar abscesses.  Eyes: Conjunctivae are normal. Pupils are equal, round, and reactive to light.  Neck: No muscular tenderness present. No edema and no erythema present.  Cardiovascular: Normal rate and regular rhythm.  No murmur heard. Respiratory: Effort normal and breath sounds normal. No stridor. No respiratory distress.  Musculoskeletal: She exhibits no edema.  Lymphadenopathy:       Head (right side): No submandibular adenopathy present.       Head (left side): No submandibular adenopathy present.    She has cervical adenopathy.       Right cervical: Posterior cervical adenopathy present.       Left cervical: Posterior cervical adenopathy present.  Neurological: She is alert and oriented to person, place, and time. She has normal strength. Gait normal.  Skin: Skin is warm. No rash noted. No erythema.  Psychiatric: She has a normal mood and affect.  Well groomed, good eye contact.    ASSESSMENT AND PLAN:   [name] was seen today for fever, sore throat, generalized body aches, fatigue and chills.  Diagnoses and all orders for this visit:  Sore throat  Rapid strep here in the office negative.  Clinical findings do not suggest strep infection, so culture was not sent. Symptomatic treatment recommended.  -     POCT rapid strep A  URI, acute  Symptoms suggests a viral etiology, symptomatic treatment recommended. Because exposure to influenza + fever Tamiflu was recommended even though rapid flu was negative.  Instructed to monitor for signs of  complications,clearly instructed about warning signs. I also explained that cough may get worse; this and nasal congestion can last a few days and sometimes weeks. F/U as needed.   -     POC Influenza A&B (Binax test)  Exposure to the flu -     POC Influenza A&B (Binax test) -     oseltamivir (TAMIFLU) 75 MG capsule; Take 1 capsule (75 mg total) by mouth 2 (two) times daily for 5 days.    -[name] advised to seek attention immediately if symptoms worsen or to follow if they persist or new concerns arise.        Avir Deruiter G. Swaz[place], MD  [facility]. [place] office.

## 2017-10-13 ENCOUNTER — Encounter: Payer: Self-pay | Admitting: Family Medicine

## 2018-05-18 LAB — OB RESULTS CONSOLE HIV ANTIBODY (ROUTINE TESTING): HIV: NONREACTIVE

## 2018-05-18 LAB — OB RESULTS CONSOLE HEPATITIS B SURFACE ANTIGEN: Hepatitis B Surface Ag: NEGATIVE

## 2018-05-18 LAB — OB RESULTS CONSOLE RPR: RPR: NONREACTIVE

## 2018-05-18 LAB — OB RESULTS CONSOLE GC/CHLAMYDIA
Chlamydia: NEGATIVE
Gonorrhea: NEGATIVE

## 2018-05-18 LAB — OB RESULTS CONSOLE RUBELLA ANTIBODY, IGM: Rubella: IMMUNE

## 2018-10-25 LAB — OB RESULTS CONSOLE GBS: GBS: NEGATIVE

## 2018-10-27 NOTE — L&D Delivery Note (Signed)
Delivery Note At 7:26 AM a viable female was delivered via VBAC, Spontaneous (Presentation: LOA).  APGAR: 8, 9; weight 8 lb 2 oz (3685 g).   Placenta status: delivered spontaneously and completely.  Cord: three vessel with the following complications: nuchal x1, loose and easily reducible.   Anesthesia:  None Episiotomy: None Lacerations: 2nd degree;Perineal Suture Repair: 3.0 vicryl , 4.0 vicryl, and 4.0 monocril  Est. Blood Loss (mL):  748  Mom to postpartum.  Baby to Couplet care / Skin to Skin.  Janeece Riggers 11/18/2018, 8:44 AM

## 2018-11-18 ENCOUNTER — Other Ambulatory Visit: Payer: Self-pay

## 2018-11-18 ENCOUNTER — Inpatient Hospital Stay (HOSPITAL_COMMUNITY)
Admission: AD | Admit: 2018-11-18 | Discharge: 2018-11-19 | DRG: 807 | Disposition: A | Discharge status: Home / Self Care | Payer: No Typology Code available for payment source | Attending: Obstetrics and Gynecology | Admitting: Obstetrics and Gynecology

## 2018-11-18 ENCOUNTER — Encounter (HOSPITAL_COMMUNITY): Payer: Self-pay

## 2018-11-18 DIAGNOSIS — O4290 Premature rupture of membranes, unspecified as to length of time between rupture and onset of labor, unspecified weeks of gestation: Secondary | ICD-10-CM

## 2018-11-18 DIAGNOSIS — Z3483 Encounter for supervision of other normal pregnancy, third trimester: Secondary | ICD-10-CM | POA: Diagnosis present

## 2018-11-18 DIAGNOSIS — M419 Scoliosis, unspecified: Secondary | ICD-10-CM | POA: Diagnosis present

## 2018-11-18 DIAGNOSIS — Z3A39 39 weeks gestation of pregnancy: Secondary | ICD-10-CM

## 2018-11-18 DIAGNOSIS — O26893 Other specified pregnancy related conditions, third trimester: Secondary | ICD-10-CM | POA: Diagnosis present

## 2018-11-18 DIAGNOSIS — O34219 Maternal care for unspecified type scar from previous cesarean delivery: Secondary | ICD-10-CM | POA: Diagnosis present

## 2018-11-18 DIAGNOSIS — O4292 Full-term premature rupture of membranes, unspecified as to length of time between rupture and onset of labor: Secondary | ICD-10-CM | POA: Diagnosis present

## 2018-11-18 LAB — RPR: RPR Ser Ql: NONREACTIVE

## 2018-11-18 LAB — CBC
HCT: 36.5 % (ref 36.0–46.0)
HEMOGLOBIN: 12.5 g/dL (ref 12.0–15.0)
MCH: 32.3 pg (ref 26.0–34.0)
MCHC: 34.2 g/dL (ref 30.0–36.0)
MCV: 94.3 fL (ref 80.0–100.0)
Platelets: 173 10*3/uL (ref 150–400)
RBC: 3.87 MIL/uL (ref 3.87–5.11)
RDW: 14.7 % (ref 11.5–15.5)
WBC: 8.8 10*3/uL (ref 4.0–10.5)
nRBC: 0 % (ref 0.0–0.2)

## 2018-11-18 LAB — TYPE AND SCREEN
ABO/RH(D): O POS
Antibody Screen: NEGATIVE

## 2018-11-18 LAB — POCT FERN TEST: POCT Fern Test: POSITIVE

## 2018-11-18 MED ORDER — OXYTOCIN 40 UNITS IN NORMAL SALINE INFUSION - SIMPLE MED: 2.5000 [IU]/h | INTRAVENOUS | Status: DC

## 2018-11-18 MED ORDER — OXYCODONE-ACETAMINOPHEN 5-325 MG PO TABS: 1.0000 | ORAL_TABLET | ORAL | Status: DC | PRN

## 2018-11-18 MED ORDER — WITCH HAZEL-GLYCERIN EX PADS: 1.0000 "application " | MEDICATED_PAD | CUTANEOUS | Status: DC | PRN

## 2018-11-18 MED ORDER — METHYLERGONOVINE MALEATE 0.2 MG/ML IJ SOLN
INTRAMUSCULAR | Status: AC
  Filled 2018-11-18: qty 1

## 2018-11-18 MED ORDER — LACTATED RINGERS IV SOLN: 500.0000 mL | INTRAVENOUS | Status: DC | PRN

## 2018-11-18 MED ORDER — ACETAMINOPHEN 325 MG PO TABS: 650.0000 mg | ORAL_TABLET | ORAL | Status: DC | PRN

## 2018-11-18 MED ORDER — ONDANSETRON HCL 4 MG PO TABS: 4.0000 mg | ORAL_TABLET | ORAL | Status: DC | PRN

## 2018-11-18 MED ORDER — COCONUT OIL OIL: 1.0000 "application " | TOPICAL_OIL | Status: DC | PRN

## 2018-11-18 MED ORDER — ONDANSETRON HCL 4 MG/2ML IJ SOLN: 4.0000 mg | Freq: Four times a day (QID) | INTRAMUSCULAR | Status: DC | PRN

## 2018-11-18 MED ORDER — OXYCODONE-ACETAMINOPHEN 5-325 MG PO TABS: 2.0000 | ORAL_TABLET | ORAL | Status: DC | PRN

## 2018-11-18 MED ORDER — ONDANSETRON HCL 4 MG/2ML IJ SOLN: 4.0000 mg | INTRAMUSCULAR | Status: DC | PRN

## 2018-11-18 MED ORDER — IBUPROFEN 600 MG PO TABS
600.0000 mg | ORAL_TABLET | Freq: Four times a day (QID) | ORAL | Status: DC | PRN
  Administered 2018-11-18: 600 mg via ORAL
  Filled 2018-11-18: qty 1

## 2018-11-18 MED ORDER — SOD CITRATE-CITRIC ACID 500-334 MG/5ML PO SOLN: 30.0000 mL | ORAL | Status: DC | PRN

## 2018-11-18 MED ORDER — TERBUTALINE SULFATE 1 MG/ML IJ SOLN
0.2500 mg | Freq: Once | INTRAMUSCULAR | Status: DC | PRN
  Filled 2018-11-18: qty 1

## 2018-11-18 MED ORDER — DIBUCAINE 1 % RE OINT: 1.0000 "application " | TOPICAL_OINTMENT | RECTAL | Status: DC | PRN

## 2018-11-18 MED ORDER — PRENATAL MULTIVITAMIN CH
1.0000 | ORAL_TABLET | Freq: Every day | ORAL | Status: DC
  Administered 2018-11-18 – 2018-11-19 (×2): 1 via ORAL
  Filled 2018-11-18 (×2): qty 1

## 2018-11-18 MED ORDER — OXYCODONE HCL 5 MG PO TABS: 10.0000 mg | ORAL_TABLET | ORAL | Status: DC | PRN

## 2018-11-18 MED ORDER — BENZOCAINE-MENTHOL 20-0.5 % EX AERO: 1.0000 "application " | INHALATION_SPRAY | CUTANEOUS | Status: DC | PRN

## 2018-11-18 MED ORDER — METHYLERGONOVINE MALEATE 0.2 MG/ML IJ SOLN
0.2000 mg | Freq: Once | INTRAMUSCULAR | Status: AC
  Administered 2018-11-18: 0.2 mg via INTRAMUSCULAR

## 2018-11-18 MED ORDER — DIPHENHYDRAMINE HCL 25 MG PO CAPS: 25.0000 mg | ORAL_CAPSULE | Freq: Four times a day (QID) | ORAL | Status: DC | PRN

## 2018-11-18 MED ORDER — LIDOCAINE HCL (PF) 1 % IJ SOLN
30.0000 mL | INTRAMUSCULAR | Status: DC | PRN
  Administered 2018-11-18: 30 mL via SUBCUTANEOUS
  Filled 2018-11-18: qty 30

## 2018-11-18 MED ORDER — ZOLPIDEM TARTRATE 5 MG PO TABS: 5.0000 mg | ORAL_TABLET | Freq: Every evening | ORAL | Status: DC | PRN

## 2018-11-18 MED ORDER — IBUPROFEN 600 MG PO TABS
600.0000 mg | ORAL_TABLET | Freq: Four times a day (QID) | ORAL | Status: DC
  Administered 2018-11-18 – 2018-11-19 (×4): 600 mg via ORAL
  Filled 2018-11-18 (×4): qty 1

## 2018-11-18 MED ORDER — SIMETHICONE 80 MG PO CHEW: 80.0000 mg | CHEWABLE_TABLET | ORAL | Status: DC | PRN

## 2018-11-18 MED ORDER — LACTATED RINGERS IV SOLN
INTRAVENOUS | Status: DC
  Administered 2018-11-18 (×3): via INTRAVENOUS

## 2018-11-18 MED ORDER — OXYTOCIN 40 UNITS IN NORMAL SALINE INFUSION - SIMPLE MED
1.0000 m[IU]/min | INTRAVENOUS | Status: DC
  Administered 2018-11-18: 2 m[IU]/min via INTRAVENOUS
  Administered 2018-11-18: 1 m[IU]/min via INTRAVENOUS
  Filled 2018-11-18: qty 1000

## 2018-11-18 MED ORDER — OXYTOCIN BOLUS FROM INFUSION
500.0000 mL | Freq: Once | INTRAVENOUS | Status: AC
  Administered 2018-11-18: 500 mL via INTRAVENOUS

## 2018-11-18 MED ORDER — OXYCODONE HCL 5 MG PO TABS: 5.0000 mg | ORAL_TABLET | ORAL | Status: DC | PRN

## 2018-11-18 MED ORDER — TETANUS-DIPHTH-ACELL PERTUSSIS 5-2.5-18.5 LF-MCG/0.5 IM SUSP: 0.5000 mL | Freq: Once | INTRAMUSCULAR | Status: DC

## 2018-11-18 MED ORDER — SENNOSIDES-DOCUSATE SODIUM 8.6-50 MG PO TABS
2.0000 | ORAL_TABLET | ORAL | Status: DC
  Administered 2018-11-19: 2 via ORAL
  Filled 2018-11-18: qty 2

## 2018-11-18 NOTE — Progress Notes (Signed)
Labor Progress Note Phyllistine Ogasawara is a 28 y.o. female, G3P2002 at [redacted]w[redacted]d who experienced SROM at home at 2330 on 11/17/18.  Subjective:  Ms. Kujawa is resting, though her contractions have become more painful and more frequent with the small dose of pitocin she is on. She is not able to sleep.  Objective:  BP 113/63   Pulse 74   Temp 98.1 F (36.7 C) (Oral)   Resp 16   Ht 5\' 11"  (1.803 m)   Wt 78.5 kg   SpO2 99%   BMI 24.13 kg/m    FHT: NICHD category 1, moderate variability, accels present, decels absent. UC:  Three in 10 minutes, lasting 90-110 seconds SVE:   Deferred (prev 4/50/-2) Pitocin at 2 mu/min MVUs n/a  Assessment/Plan:  Fetal Wellbeing: Cat1 Labor: Progressing, ROM x 5.5h Preeclampsia: n/a GBS:  neg Pain Control:  natural, coping techniques Anticipated MOD:  SVD   Jonetta Speak, CNM 11/18/2018, 4:57 AM

## 2018-11-18 NOTE — Progress Notes (Signed)
Labor Progress Note Kristina Aguilar a 27 y.o.female, G3P2002at 39w5dwho experienced SROM at home at 2330 on 11/17/18.   Subjective:  Pt is swaying beside the bed in her partner's arms, declining pain meds/nitrous, breathing through ctxns. She is very calm and focused.  Objective:  BP 110/68   Pulse 82   Temp 97.9 F (36.6 C) (Oral)   Resp 18   Ht 5\' 11"  (1.803 m)   Wt 78.5 kg   SpO2 99%   BMI 24.13 kg/m     FHT: Baseline 130, NICHD category 1, moderate variability, accels present, decels absent. UC:   4 in 10 minutes, lasting 60-80 seconds. SVE:   Dilation: 8 Effacement (%): 90 Station: -1 Exam by:: B. Price, RN Pitocin at 2 mu/min MVUs n/a  Assessment/Plan:  Fetal Wellbeing: Cat 1 Labor: Progressing well, delivery imminent. ROM x 7h. Preeclampsia:  normotensive GBS:   neg Pain Control:  natural, relaxation techniques, partner support Anticipated MOD:  SVD    Jonetta Speak, CNM 11/18/2018, 6:38 AM

## 2018-11-18 NOTE — MAU Note (Signed)
Pt states water broke at 11:30-clear/pink. Reports contractions every 5 mins. Reports good fetal movement. Cervix was closed on Thursday.

## 2018-11-18 NOTE — Lactation Note (Signed)
This note was copied from a baby's chart. Lactation Consultation Note  Patient Name: Kristina Aguilar [id number] Date: 11/18/2018 Reason for consult: Initial assessment;Term  Initial visit with P3 and 4 hours old mom who successfully breastfed her other 2 children, one for 17 months and one for 11 months.  Mom denies any past difficulties with breastfeeding. Mom reports baby has latched well this morning with no pain/discomfort.  Mom states she feels comfortable in various positions.  Reviewed hand expression, breast compression, OPLC services, BFSG, and phone number on pamphlet.  Infant noted to be searching for hands and encouraged mom to feed infant again at this time.  Maternal Data Has patient been taught Hand Expression?: Yes(mom confident with hand expression, reinforced) Does the patient have breastfeeding experience prior to this delivery?: Yes  Interventions Interventions: Breast massage;Breast compression;Breast feeding basics reviewed  Virgia Land 11/18/2018, 11:42 AM

## 2018-11-18 NOTE — H&P (Addendum)
Kristina Aguilar is a 28 y.o. female, G3P2002 at [redacted]w[redacted]d presenting for SROM at home at 2330, C/blood tinged, no odor. Per MAU provider, pt is grossly ruptured and fern positive. Kristina Aguilar has a PMH significant for a primary C/S for breech followed by a VBAC, mild scoliosis, and hydrocephaly with shunt placement in childhood (cleared by neurology for vaginal birth with prior pregnancy). 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 Kristina Aguilar desires natural childbirth. Written Birth Plan is in pt's room and includes delayed cord clamping, S2S, early BFing, no eye ointment for baby Kristina Aguilar, and no HepB shot for baby Kristina Aguilar.  Patient Active Problem List   Diagnosis Date Noted  . Normal labor 11/18/2018  . Patient desires vaginal birth after cesarean section (VBAC) 11/18/2018  . Amniotic fluid leaking 11/18/2018    History of present pregnancy: Patient entered care at 13 weeks.   EDC of 11/20/18 was established by 13 week [place].   Anatomy scan:  20 weeks, with normal findings and an anterior placenta.   Additional [place] evaluations:  09/28/18 f/u for pyelectasis, which resolved.   Significant prenatal events:  none  Last evaluation: 11/16/18 ROB  OB History    Gravida  3   Para  2   Term  2   Preterm      AB      Living  2     SAB      TAB      Ectopic      Multiple  0   Live Births  2          Past Medical History:  Diagnosis Date  . Anemia    hx 2011  . GERD (gastroesophageal reflux disease)   . Hydrocephalus with operating shunt [facility])    surgery 2001 and then revised 2002, Neurologist Dr Alvester Morin at [place]  . Postpartum care following cesarean delivery (8/28) 06/23/2014  . Postpartum care following vbac delivery (9/18) 07/14/2016  . Scoliosis   . VBAC, delivered, current hospitalization 07/14/2016   Past Surgical History:  Procedure Laterality Date  . CESAREAN SECTION N/A 06/23/2014    Procedure: Primary CESAREAN SECTION;  Surgeon: Robley Fries, MD;  Location: [facility];  Service: Obstetrics;  Laterality: N/A;  EDD: 06/27/14  . CESAREAN SECTION CLASSICAL  2015  . COLONOSCOPY    . VENTRICULOPERITONEAL SHUNT     09/2000 and revised in 03/2001  . WISDOM TOOTH EXTRACTION  2007   Family History: family history includes Asthma in her mother; Heart disease in her mother; Hyperlipidemia in her father and mother; Hypertension in her father and mother; Pseudotumor cerebri in her sister. Social History:  reports that she has never smoked. She has never used smokeless tobacco. She reports that she does not drink alcohol or use drugs.   Prenatal Transfer Tool  Maternal Diabetes: No Genetic Screening: Declined Maternal Ultrasounds/Referrals: Normal Fetal Ultrasounds or other Referrals:  Other:  follow up for pyelectasis=resolved Maternal Substance Abuse:  No Significant Maternal Medications:  None Significant Maternal Lab Results: Lab values include: Group B Strep negative  TDAP no Flu no  ROS:  All ten systems reviewed and negative, except as noted. Denies VB.  Denies headache, epigatrsic pain, and visual sx. Reports good FM.   No Known Allergies Vitals:   11/18/18 0036 11/18/18 0121  BP: 127/77 117/63  Pulse: 72 71  Resp: 16   Temp: 98.1 F (  36.7 C) 99.8 F (37.7 C)  TempSrc: Oral Oral  SpO2: 99%   Weight: 78.5 kg   Height: 5\' 11"  (1.803 m)    Results for orders placed or performed during the hospital encounter of 11/18/18 (from the past 24 hour(s))  POCT fern test     Status: None   Collection Time: 11/18/18 12:52 AM  Result Value Ref Range   POCT Fern Test Positive = ruptured amniotic membanes     Dilation: 4 Effacement (%): 80 Station: -2 Exam by:: Leafy Kindle, CNM Blood pressure 117/63, pulse 71, temperature 99.8 F (37.7 C), temperature source Oral, resp. rate 16, height 5\' 11"  (1.803 m), weight 78.5 kg, SpO2 99 %, currently breastfeeding.  Chest  clear Heart RRR without murmur Abd gravid, NT Pelvic: 4/80/-2 Ext: No signs or symptoms of DVT  FHR: Baseline 125, NICHD category 1, moderate variability, accels present, decels absent.  UCs:  2 in 10 minutes, lasting 80-100 seconds  Prenatal labs: ABO, Rh:  O+ Antibody:  neg Rubella:   Imm RPR:   neg HBsAg:   neg HIV:   NR GBS:  neg Sickle cell/Hgb electrophoresis:  AA GC:  neg Chlamydia:  neg Genetic screenings:  declined Glucola:  100 Hgb 13.5 at NOB, 12.6 at 28 weeks  Assessment: 28 y.o., G3P2002 at 39+4 weeks 4/80/-2 and contracting on her own, pain 5/10 SROM x 1.5 hr Category 1 tracing  Plan: Admit to [employer] Routine [facility] orders Observe for progress, augment as needed Natural childbirth     Jonetta Speak, CNM 11/18/2018, [phone]

## 2018-11-19 LAB — CBC
HCT: 31 % — ABNORMAL LOW (ref 36.0–46.0)
Hemoglobin: 10.4 g/dL — ABNORMAL LOW (ref 12.0–15.0)
MCH: 31.6 pg (ref 26.0–34.0)
MCHC: 33.5 g/dL (ref 30.0–36.0)
MCV: 94.2 fL (ref 80.0–100.0)
PLATELETS: 138 10*3/uL — AB (ref 150–400)
RBC: 3.29 MIL/uL — ABNORMAL LOW (ref 3.87–5.11)
RDW: 14.7 % (ref 11.5–15.5)
WBC: 11.1 10*3/uL — ABNORMAL HIGH (ref 4.0–10.5)
nRBC: 0 % (ref 0.0–0.2)

## 2018-11-19 MED ORDER — IBUPROFEN 600 MG PO TABS: 600.0000 mg | ORAL_TABLET | Freq: Four times a day (QID) | ORAL | 0 refills | Status: DC | PRN

## 2018-11-19 NOTE — Discharge Summary (Signed)
SVD OB Discharge Summary     Patient Name: Kristina Aguilar DOB: February 01, 1991 MRN: [id number]  Date of admission: 11/18/2018 Delivering MD: Kristina Aguilar  Date of delivery: 11/18/2018 Type of delivery: SVD  Newborn Data: Sex: Baby Female  Circumcision: out pt desired Live born female  Birth Weight: 8 lb 2 oz (3685 g) APGAR: 8, 9  Newborn Delivery   Birth date/time:  11/18/2018 07:26:00 Delivery type:  VBAC, Spontaneous     Feeding: breast Infant being discharge to home with mother in stable condition.   Admitting diagnosis: 39.4WKS WATER BROKE Intrauterine pregnancy: [redacted]w[redacted]d     Secondary diagnosis:  Active Problems:   Normal labor   Patient desires vaginal birth after cesarean section (VBAC)   Amniotic fluid leaking   Full-term premature rupture of membranes   Normal postpartum course                                Complications: None                                                              Intrapartum Procedures: spontaneous vaginal delivery Postpartum Procedures: none Complications-Operative and Postpartum: 2nd degree degree perineal laceration Augmentation: None   History of Present Illness: [name] is a 28 y.o. female, G3P3003, who presents at [redacted]w[redacted]d weeks gestation. The patient has been followed at  [facility]  Her pregnancy has been complicated by:  Patient Active Problem List   Diagnosis Date Noted  . Normal postpartum course 11/19/2018  . Normal labor 11/18/2018  . Patient desires vaginal birth after cesarean section (VBAC) 11/18/2018  . Amniotic fluid leaking 11/18/2018  . Full-term premature rupture of membranes 11/18/2018   Hospital course:  Onset of Labor With Vaginal Delivery     28 y.o. yo G3P3003 at [redacted]w[redacted]d was admitted in Latent Labor on 11/18/2018. Patient had an uncomplicated labor course as follows:  Membrane Rupture Time/Date: 11:30 PM ,11/17/2018   Intrapartum Procedures: Episiotomy:  None [1]                                         Lacerations:  2nd degree [3];Perineal [11]  Patient had a delivery of a Viable infant. 11/18/2018  Information for the patient's newborn:  Kristina, Aguilar [[id number]]  Delivery Method: VBAC, Spontaneous(Filed from Delivery Summary)   [name] has a PMH significant for a primary C/S for breech followed by a VBAC, mild scoliosis, and hydrocephaly with shunt placement in childhood (cleared by neurology for vaginal birth with prior pregnancy). 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 [name] had an uncomplicated postpartum course.  She is ambulating, tolerating a regular diet, passing flatus, and urinating well. Patient is discharged home in stable condition on 11/19/18.  Postpartum Day # 1 : S/P NSVD due to spontaneous labor, VBACx2 now.. Patient up ad lib, denies syncope or dizziness. Reports consuming regular diet without issues and denies N/V. Patient reports 0 bowel movement + passing flatus.  Denies issues with urination and  reports bleeding is "lighter."  Patient is breastfeeding and reports going well.  Desires condoms for postpartum contraception.  Pain is being appropriately managed with use of po meds. Post delivery Hgb was 10.4.   Physical exam  Vitals:   11/18/18 1600 11/18/18 2005 11/19/18 0010 11/19/18 0540  BP: 113/69 115/69 111/61 (!) 107/58  Pulse: 75 68 71 60  Resp: 18 18 18 16   Temp: 98 F (36.7 C) 98.4 F (36.9 C) 98.5 F (36.9 C) 98.5 F (36.9 C)  TempSrc: Oral Oral Oral Oral  SpO2:  98% 98% 98%  Weight:      Height:       General: alert, cooperative and no distress Lochia: appropriate Uterine Fundus: firm Perineum: approximate, no hematomas noted.  DVT Evaluation: No evidence of DVT seen on physical exam. Negative Homan's sign. No cords or calf tenderness. No significant calf/ankle edema.  Labs: Lab Results  Component Value Date    WBC 11.1 (H) 11/19/2018   HGB 10.4 (L) 11/19/2018   HCT 31.0 (L) 11/19/2018   MCV 94.2 11/19/2018   PLT 138 (L) 11/19/2018   CMP Latest Ref Rng & Units 04/13/2017  Glucose 70 - 99 mg/dL 88  BUN 6 - 23 mg/dL 15  Creatinine 1.610.40 - 0.961.20 mg/dL 0.450.76  Sodium 409135 - 811145 mEq/L 138  Potassium 3.5 - 5.1 mEq/L 4.2  Chloride 96 - 112 mEq/L 104  CO2 19 - 32 mEq/L 29  Calcium 8.4 - 10.5 mg/dL 9.9  Total Protein 6.0 - 8.3 g/dL 7.1  Total Bilirubin 0.2 - 1.2 mg/dL 0.8  Alkaline Phos 39 - 117 U/L 64  AST 0 - 37 U/L 18  ALT 0 - 35 U/L 14    Date of discharge: 11/19/2018 Discharge Diagnoses: Term Pregnancy-delivered Discharge instruction: per After Visit Summary and "Baby and Me Booklet".  After visit meds:  Allergies as of 11/19/2018   No Known Allergies     Medication List    TAKE these medications   calcium carbonate 500 MG chewable tablet Commonly known as:  TUMS - dosed in mg elemental calcium Chew 1 tablet by mouth daily as needed for indigestion or heartburn.   ibuprofen 600 MG tablet Commonly known as:  ADVIL,MOTRIN Take 1 tablet (600 mg total) by mouth every 6 (six) hours as needed for moderate pain.   prenatal multivitamin Tabs tablet Take 1 tablet by mouth daily at 12 noon.       Activity:           unrestricted and pelvic rest Advance as tolerated. Pelvic rest for 6 weeks.  Diet:                routine Medications: PNV and Ibuprofen Postpartum contraception: Condoms Condition:  Pt discharge to home with baby in stable condition   Meds: Allergies as of 11/19/2018   No Known Allergies     Medication List    TAKE these medications   calcium carbonate 500 MG chewable tablet Commonly known as:  TUMS - dosed in mg elemental calcium Chew 1 tablet by mouth daily as needed for indigestion or heartburn.   ibuprofen 600 MG tablet Commonly known as:  ADVIL,MOTRIN Take 1 tablet (600 mg total) by mouth every 6 (six) hours as needed for moderate pain.   prenatal  multivitamin Tabs tablet Take 1 tablet by mouth daily at 12 noon.       Discharge Follow Up:  Follow-up Information    [facility]  [facility].   Specialty:  Obstetrics and Gynecology Why:  6 week PPV 1 week Baby Female circ visit  Contact information: [address] [place] [address] [phone]           [place], NP-C, CNM 11/19/2018, 8:34 AM  Dale [place], FNP

## 2018-11-19 NOTE — Lactation Note (Signed)
This note was copied from a baby's chart. Lactation Consultation Note  Patient Name: Kristina Aguilar [id number] Date: 11/19/2018 Reason for consult: Follow-up assessment Mom reports baby is feeding well.  She is working on Pharmacologist.  Reviewed waking techniques and breast massage.  Minimal nipple soreness.  She is expressing colostrum and applying to nipples after feeds. Lactation outpatient services and support reviewed and encouraged prn.  Maternal Data    Feeding    LATCH Score                   Interventions    Lactation Tools Discussed/Used     Consult Status Consult Status: Complete Follow-up type: Call as needed    Huston Foley 11/19/2018, 9:49 AM

## 2020-05-30 ENCOUNTER — Ambulatory Visit: Payer: No Typology Code available for payment source | Admitting: Family Medicine

## 2020-05-30 DIAGNOSIS — Z0289 Encounter for other administrative examinations: Secondary | ICD-10-CM

## 2020-05-30 NOTE — Progress Notes (Deleted)
Kristina Aguilar DOB: 1991/08/11 Encounter date: 05/30/2020  This isa 29 y.o. female who presents to establish care. No chief complaint on file.   History of present illness:  ***   Past Medical History:  Diagnosis Date  . Anemia    hx 2011  . GERD (gastroesophageal reflux disease)   . Hydrocephalus with operating shunt [facility])    surgery 2001 and then revised 2002, Neurologist Dr Alvester Morin at [place]  . Postpartum care following cesarean delivery (8/28) 06/23/2014  . Postpartum care following vbac delivery (9/18) 07/14/2016  . Scoliosis   . VBAC, delivered, current hospitalization 07/14/2016   Past Surgical History:  Procedure Laterality Date  . CESAREAN SECTION N/A 06/23/2014   Procedure: Primary CESAREAN SECTION;  Surgeon: Robley Fries, MD;  Location: [facility];  Service: Obstetrics;  Laterality: N/A;  EDD: 06/27/14  . CESAREAN SECTION CLASSICAL  2015  . COLONOSCOPY    . VENTRICULOPERITONEAL SHUNT     09/2000 and revised in 03/2001  . WISDOM TOOTH EXTRACTION  2007   No Known Allergies No outpatient medications have been marked as taking for the 05/30/20 encounter (Appointment) with Wynn Banker, MD.   Social History   Tobacco Use  . Smoking status: Never Smoker  . Smokeless tobacco: Never Used  Substance Use Topics  . Alcohol use: No   Family History  Problem Relation Age of Onset  . Asthma Mother   . Hypertension Mother   . Heart disease Mother   . Hyperlipidemia Mother   . Hypertension Father   . Hyperlipidemia Father   . Pseudotumor cerebri Sister      Review of Systems  Objective:  There were no vitals taken for this visit.      BP Readings from Last 3 Encounters:  11/19/18 (!) 107/58  10/12/17 110/72  04/13/17 100/80   Wt Readings from Last 3 Encounters:  11/18/18 173 lb (78.5 kg)  10/12/17 132 lb 8 oz (60.1 kg)  04/13/17 132 lb 12.8 oz (60.2 kg)    Physical Exam  Assessment/Plan:  There are no diagnoses linked to this  encounter.  No follow-ups on file.  Theodis Shove, MD

## 2021-07-16 LAB — OB RESULTS CONSOLE ANTIBODY SCREEN: Antibody Screen: NEGATIVE

## 2021-07-16 LAB — OB RESULTS CONSOLE ABO/RH

## 2021-08-29 DIAGNOSIS — M9902 Segmental and somatic dysfunction of thoracic region: Secondary | ICD-10-CM | POA: Diagnosis not present

## 2021-08-29 DIAGNOSIS — M4124 Other idiopathic scoliosis, thoracic region: Secondary | ICD-10-CM | POA: Diagnosis not present

## 2021-08-29 DIAGNOSIS — M9903 Segmental and somatic dysfunction of lumbar region: Secondary | ICD-10-CM | POA: Diagnosis not present

## 2021-08-29 DIAGNOSIS — M9901 Segmental and somatic dysfunction of cervical region: Secondary | ICD-10-CM | POA: Diagnosis not present

## 2021-09-02 DIAGNOSIS — E559 Vitamin D deficiency, unspecified: Secondary | ICD-10-CM | POA: Diagnosis not present

## 2021-09-02 DIAGNOSIS — Z3481 Encounter for supervision of other normal pregnancy, first trimester: Secondary | ICD-10-CM | POA: Diagnosis not present

## 2021-09-02 DIAGNOSIS — E612 Magnesium deficiency: Secondary | ICD-10-CM | POA: Diagnosis not present

## 2021-09-02 DIAGNOSIS — Z3689 Encounter for other specified antenatal screening: Secondary | ICD-10-CM | POA: Diagnosis not present

## 2021-09-02 LAB — OB RESULTS CONSOLE HIV ANTIBODY (ROUTINE TESTING): HIV: NONREACTIVE

## 2021-09-02 LAB — OB RESULTS CONSOLE GC/CHLAMYDIA
Chlamydia: NEGATIVE
Gonorrhea: NEGATIVE

## 2021-09-02 LAB — OB RESULTS CONSOLE HEPATITIS B SURFACE ANTIGEN: Hepatitis B Surface Ag: NEGATIVE

## 2021-09-02 LAB — OB RESULTS CONSOLE RPR: RPR: NONREACTIVE

## 2021-09-10 DIAGNOSIS — M9901 Segmental and somatic dysfunction of cervical region: Secondary | ICD-10-CM | POA: Diagnosis not present

## 2021-09-10 DIAGNOSIS — M4124 Other idiopathic scoliosis, thoracic region: Secondary | ICD-10-CM | POA: Diagnosis not present

## 2021-09-10 DIAGNOSIS — M9902 Segmental and somatic dysfunction of thoracic region: Secondary | ICD-10-CM | POA: Diagnosis not present

## 2021-09-10 DIAGNOSIS — M9903 Segmental and somatic dysfunction of lumbar region: Secondary | ICD-10-CM | POA: Diagnosis not present

## 2021-09-26 DIAGNOSIS — M9902 Segmental and somatic dysfunction of thoracic region: Secondary | ICD-10-CM | POA: Diagnosis not present

## 2021-09-26 DIAGNOSIS — M9903 Segmental and somatic dysfunction of lumbar region: Secondary | ICD-10-CM | POA: Diagnosis not present

## 2021-09-26 DIAGNOSIS — M9901 Segmental and somatic dysfunction of cervical region: Secondary | ICD-10-CM | POA: Diagnosis not present

## 2021-09-26 DIAGNOSIS — M4124 Other idiopathic scoliosis, thoracic region: Secondary | ICD-10-CM | POA: Diagnosis not present

## 2021-10-10 DIAGNOSIS — M9902 Segmental and somatic dysfunction of thoracic region: Secondary | ICD-10-CM | POA: Diagnosis not present

## 2021-10-10 DIAGNOSIS — M4124 Other idiopathic scoliosis, thoracic region: Secondary | ICD-10-CM | POA: Diagnosis not present

## 2021-10-10 DIAGNOSIS — M9903 Segmental and somatic dysfunction of lumbar region: Secondary | ICD-10-CM | POA: Diagnosis not present

## 2021-10-10 DIAGNOSIS — M9901 Segmental and somatic dysfunction of cervical region: Secondary | ICD-10-CM | POA: Diagnosis not present

## 2021-10-24 DIAGNOSIS — M9903 Segmental and somatic dysfunction of lumbar region: Secondary | ICD-10-CM | POA: Diagnosis not present

## 2021-10-24 DIAGNOSIS — M4124 Other idiopathic scoliosis, thoracic region: Secondary | ICD-10-CM | POA: Diagnosis not present

## 2021-10-24 DIAGNOSIS — M9902 Segmental and somatic dysfunction of thoracic region: Secondary | ICD-10-CM | POA: Diagnosis not present

## 2021-10-24 DIAGNOSIS — M9901 Segmental and somatic dysfunction of cervical region: Secondary | ICD-10-CM | POA: Diagnosis not present

## 2021-10-25 DIAGNOSIS — Z3A21 21 weeks gestation of pregnancy: Secondary | ICD-10-CM | POA: Diagnosis not present

## 2021-10-25 DIAGNOSIS — O35EXX0 Maternal care for other (suspected) fetal abnormality and damage, fetal genitourinary anomalies, not applicable or unspecified: Secondary | ICD-10-CM | POA: Diagnosis not present

## 2021-10-27 NOTE — L&D Delivery Note (Signed)
   Delivery Note:   [id number] at [redacted]w[redacted]d  Admitting diagnosis: Encounter for induction of labor [Z34.90] Risks:  Hx G1 C/S for breech, VBAC x 2 Hx hydrocephalus with shunt placement in childhood, cleared by neurology for vaginal birth in previous pregnancies GBS positive, risk-based treatment  First Stage:  Induction of labor:yes Onset of labor: 1300 Augmentation: AROM ROM: 1111 03/13/2022 , copious clear fluid Active labor onset: 1500 03/13/2022  Analgesia /Anesthesia/Pain control intrapartum: hydrotherapy - shower  Second Stage:  Complete dilation at 03/13/2022  1550 Onset of pushing at 1530, patient with strong urge to push prior to complete dilation. Pushed in [place], L and R lateral. Anterior lip reduced while patient pushing over several contractions. FHR second stage category 1   Pushing in L lateral position with CNM and L&D staff support, Cristie Hem present for birth and supportive. Nuchal Cord: No  Delivery of a Live born female  Birth Weight:  pending APGAR: 9, 9  Newborn Delivery   Birth date/time: 03/13/2022 16:10:00 Delivery type: VBAC, Spontaneous      in cephalic presentation, position OA to ROT, posterior shoulder expulsed first w/o assistance., Vigorous baby with strong cry delivered and placed on mother's chest.   Cord double clamped after placenta delivered, cut by Alex.  Collection of cord blood for typing completed. Cord blood donation-None  Arterial cord blood sample-No    Third Stage:  Pitocin 10 units IM given after newborn delivered. Placenta delivered-Spontaneous  with 3 vessels . Uterine tone firm, bleeding large gush with placental expulsion then minimal thereafter  Placenta to L&D for disposal.  1st degree;Vaginal  laceration identified.  Episiotomy:None  Local analgesia: 1% lido  Repair:3.0 vicryl in standard fashion Est. Blood Loss ([id number]   Complications: None   Mom to postpartum.  Baby Iris to Couplet care / Skin to Skin.  Delivery  Report:  Review the Delivery Report for details.     Signed: Juliene Pina, CNM, MSN 03/13/2022, 5:01 PM

## 2021-11-07 DIAGNOSIS — M9901 Segmental and somatic dysfunction of cervical region: Secondary | ICD-10-CM | POA: Diagnosis not present

## 2021-11-07 DIAGNOSIS — M9902 Segmental and somatic dysfunction of thoracic region: Secondary | ICD-10-CM | POA: Diagnosis not present

## 2021-11-07 DIAGNOSIS — M9903 Segmental and somatic dysfunction of lumbar region: Secondary | ICD-10-CM | POA: Diagnosis not present

## 2021-11-07 DIAGNOSIS — M4124 Other idiopathic scoliosis, thoracic region: Secondary | ICD-10-CM | POA: Diagnosis not present

## 2021-11-22 DIAGNOSIS — Z362 Encounter for other antenatal screening follow-up: Secondary | ICD-10-CM | POA: Diagnosis not present

## 2021-11-26 DIAGNOSIS — M9903 Segmental and somatic dysfunction of lumbar region: Secondary | ICD-10-CM | POA: Diagnosis not present

## 2021-11-26 DIAGNOSIS — M9901 Segmental and somatic dysfunction of cervical region: Secondary | ICD-10-CM | POA: Diagnosis not present

## 2021-11-26 DIAGNOSIS — M4124 Other idiopathic scoliosis, thoracic region: Secondary | ICD-10-CM | POA: Diagnosis not present

## 2021-11-26 DIAGNOSIS — M9902 Segmental and somatic dysfunction of thoracic region: Secondary | ICD-10-CM | POA: Diagnosis not present

## 2021-12-09 DIAGNOSIS — M9902 Segmental and somatic dysfunction of thoracic region: Secondary | ICD-10-CM | POA: Diagnosis not present

## 2021-12-09 DIAGNOSIS — M4124 Other idiopathic scoliosis, thoracic region: Secondary | ICD-10-CM | POA: Diagnosis not present

## 2021-12-09 DIAGNOSIS — M9903 Segmental and somatic dysfunction of lumbar region: Secondary | ICD-10-CM | POA: Diagnosis not present

## 2021-12-09 DIAGNOSIS — M9901 Segmental and somatic dysfunction of cervical region: Secondary | ICD-10-CM | POA: Diagnosis not present

## 2021-12-17 DIAGNOSIS — Z3689 Encounter for other specified antenatal screening: Secondary | ICD-10-CM | POA: Diagnosis not present

## 2021-12-23 DIAGNOSIS — M4124 Other idiopathic scoliosis, thoracic region: Secondary | ICD-10-CM | POA: Diagnosis not present

## 2021-12-23 DIAGNOSIS — M9901 Segmental and somatic dysfunction of cervical region: Secondary | ICD-10-CM | POA: Diagnosis not present

## 2021-12-23 DIAGNOSIS — M9902 Segmental and somatic dysfunction of thoracic region: Secondary | ICD-10-CM | POA: Diagnosis not present

## 2021-12-23 DIAGNOSIS — M9903 Segmental and somatic dysfunction of lumbar region: Secondary | ICD-10-CM | POA: Diagnosis not present

## 2021-12-27 DIAGNOSIS — Z3A30 30 weeks gestation of pregnancy: Secondary | ICD-10-CM | POA: Diagnosis not present

## 2021-12-27 DIAGNOSIS — O99810 Abnormal glucose complicating pregnancy: Secondary | ICD-10-CM | POA: Diagnosis not present

## 2022-01-23 DIAGNOSIS — O368993 Maternal care for other specified fetal problems, unspecified trimester, fetus 3: Secondary | ICD-10-CM | POA: Diagnosis not present

## 2022-01-23 DIAGNOSIS — Z3A33 33 weeks gestation of pregnancy: Secondary | ICD-10-CM | POA: Diagnosis not present

## 2022-01-28 DIAGNOSIS — M4124 Other idiopathic scoliosis, thoracic region: Secondary | ICD-10-CM | POA: Diagnosis not present

## 2022-01-28 DIAGNOSIS — M9901 Segmental and somatic dysfunction of cervical region: Secondary | ICD-10-CM | POA: Diagnosis not present

## 2022-01-28 DIAGNOSIS — M9903 Segmental and somatic dysfunction of lumbar region: Secondary | ICD-10-CM | POA: Diagnosis not present

## 2022-01-28 DIAGNOSIS — M9902 Segmental and somatic dysfunction of thoracic region: Secondary | ICD-10-CM | POA: Diagnosis not present

## 2022-02-04 DIAGNOSIS — Z3A35 35 weeks gestation of pregnancy: Secondary | ICD-10-CM | POA: Diagnosis not present

## 2022-02-04 LAB — OB RESULTS CONSOLE GBS: GBS: POSITIVE

## 2022-02-11 DIAGNOSIS — M9901 Segmental and somatic dysfunction of cervical region: Secondary | ICD-10-CM | POA: Diagnosis not present

## 2022-02-11 DIAGNOSIS — M9903 Segmental and somatic dysfunction of lumbar region: Secondary | ICD-10-CM | POA: Diagnosis not present

## 2022-02-11 DIAGNOSIS — M9902 Segmental and somatic dysfunction of thoracic region: Secondary | ICD-10-CM | POA: Diagnosis not present

## 2022-02-11 DIAGNOSIS — M4124 Other idiopathic scoliosis, thoracic region: Secondary | ICD-10-CM | POA: Diagnosis not present

## 2022-03-03 DIAGNOSIS — M9902 Segmental and somatic dysfunction of thoracic region: Secondary | ICD-10-CM | POA: Diagnosis not present

## 2022-03-03 DIAGNOSIS — M9903 Segmental and somatic dysfunction of lumbar region: Secondary | ICD-10-CM | POA: Diagnosis not present

## 2022-03-03 DIAGNOSIS — M4124 Other idiopathic scoliosis, thoracic region: Secondary | ICD-10-CM | POA: Diagnosis not present

## 2022-03-03 DIAGNOSIS — M9901 Segmental and somatic dysfunction of cervical region: Secondary | ICD-10-CM | POA: Diagnosis not present

## 2022-03-11 DIAGNOSIS — O368930 Maternal care for other specified fetal problems, third trimester, not applicable or unspecified: Secondary | ICD-10-CM | POA: Diagnosis not present

## 2022-03-11 DIAGNOSIS — Z3A40 40 weeks gestation of pregnancy: Secondary | ICD-10-CM | POA: Diagnosis not present

## 2022-03-12 ENCOUNTER — Encounter (HOSPITAL_COMMUNITY): Payer: Self-pay | Admitting: *Deleted

## 2022-03-12 ENCOUNTER — Telehealth (HOSPITAL_COMMUNITY): Payer: Self-pay | Admitting: *Deleted

## 2022-03-12 ENCOUNTER — Other Ambulatory Visit: Payer: Self-pay

## 2022-03-12 NOTE — Telephone Encounter (Signed)
Preadmission screen  

## 2022-03-13 ENCOUNTER — Encounter (HOSPITAL_COMMUNITY): Payer: Self-pay

## 2022-03-13 ENCOUNTER — Inpatient Hospital Stay (HOSPITAL_COMMUNITY)
Admission: AD | Admit: 2022-03-13 | Discharge: 2022-03-14 | DRG: 807 | Disposition: A | Discharge status: Home / Self Care | Payer: BC Managed Care – PPO | Attending: Obstetrics and Gynecology | Admitting: Obstetrics and Gynecology

## 2022-03-13 ENCOUNTER — Inpatient Hospital Stay (HOSPITAL_COMMUNITY): Payer: BC Managed Care – PPO

## 2022-03-13 ENCOUNTER — Other Ambulatory Visit: Payer: Self-pay

## 2022-03-13 DIAGNOSIS — O9902 Anemia complicating childbirth: Secondary | ICD-10-CM | POA: Diagnosis not present

## 2022-03-13 DIAGNOSIS — Z3490 Encounter for supervision of normal pregnancy, unspecified, unspecified trimester: Secondary | ICD-10-CM | POA: Diagnosis present

## 2022-03-13 DIAGNOSIS — O34219 Maternal care for unspecified type scar from previous cesarean delivery: Secondary | ICD-10-CM | POA: Diagnosis not present

## 2022-03-13 DIAGNOSIS — B951 Streptococcus, group B, as the cause of diseases classified elsewhere: Secondary | ICD-10-CM | POA: Diagnosis present

## 2022-03-13 DIAGNOSIS — O480 Post-term pregnancy: Principal | ICD-10-CM | POA: Diagnosis present

## 2022-03-13 DIAGNOSIS — O99824 Streptococcus B carrier state complicating childbirth: Secondary | ICD-10-CM | POA: Diagnosis not present

## 2022-03-13 DIAGNOSIS — Z3A40 40 weeks gestation of pregnancy: Secondary | ICD-10-CM | POA: Diagnosis not present

## 2022-03-13 DIAGNOSIS — O34211 Maternal care for low transverse scar from previous cesarean delivery: Secondary | ICD-10-CM | POA: Diagnosis not present

## 2022-03-13 LAB — CBC
HCT: 34.6 % — ABNORMAL LOW (ref 36.0–46.0)
Hemoglobin: 11.5 g/dL — ABNORMAL LOW (ref 12.0–15.0)
MCH: 28.8 pg (ref 26.0–34.0)
MCHC: 33.2 g/dL (ref 30.0–36.0)
MCV: 86.5 fL (ref 80.0–100.0)
Platelets: 218 10*3/uL (ref 150–400)
RBC: 4 MIL/uL (ref 3.87–5.11)
RDW: 15.9 % — ABNORMAL HIGH (ref 11.5–15.5)
WBC: 10.3 10*3/uL (ref 4.0–10.5)
nRBC: 0 % (ref 0.0–0.2)

## 2022-03-13 LAB — RPR: RPR Ser Ql: NONREACTIVE

## 2022-03-13 LAB — TYPE AND SCREEN
ABO/RH(D): O POS
Antibody Screen: NEGATIVE

## 2022-03-13 MED ORDER — ONDANSETRON HCL 4 MG/2ML IJ SOLN: 4.0000 mg | Freq: Four times a day (QID) | INTRAMUSCULAR | Status: DC | PRN

## 2022-03-13 MED ORDER — DIPHENHYDRAMINE HCL 25 MG PO CAPS: 25.0000 mg | ORAL_CAPSULE | Freq: Four times a day (QID) | ORAL | Status: DC | PRN

## 2022-03-13 MED ORDER — BENZOCAINE-MENTHOL 20-0.5 % EX AERO
1.0000 "application " | INHALATION_SPRAY | CUTANEOUS | Status: DC | PRN
  Filled 2022-03-13: qty 56

## 2022-03-13 MED ORDER — LIDOCAINE HCL (PF) 1 % IJ SOLN
30.0000 mL | INTRAMUSCULAR | Status: AC | PRN
  Administered 2022-03-13: 30 mL via SUBCUTANEOUS
  Filled 2022-03-13: qty 30

## 2022-03-13 MED ORDER — DIBUCAINE (PERIANAL) 1 % EX OINT: 1.0000 "application " | TOPICAL_OINTMENT | CUTANEOUS | Status: DC | PRN

## 2022-03-13 MED ORDER — PRENATAL MULTIVITAMIN CH
1.0000 | ORAL_TABLET | Freq: Every day | ORAL | Status: DC
  Administered 2022-03-14: 1 via ORAL
  Filled 2022-03-13: qty 1

## 2022-03-13 MED ORDER — OXYTOCIN 10 UNIT/ML IJ SOLN
10.0000 [IU] | Freq: Once | INTRAMUSCULAR | Status: AC
  Administered 2022-03-13: 10 [IU] via INTRAMUSCULAR
  Filled 2022-03-13: qty 1

## 2022-03-13 MED ORDER — ACETAMINOPHEN 325 MG PO TABS: 650.0000 mg | ORAL_TABLET | ORAL | Status: DC | PRN

## 2022-03-13 MED ORDER — SOD CITRATE-CITRIC ACID 500-334 MG/5ML PO SOLN: 30.0000 mL | ORAL | Status: DC | PRN

## 2022-03-13 MED ORDER — SODIUM CHLORIDE 0.9 % IV SOLN: 5.0000 10*6.[IU] | Freq: Once | INTRAVENOUS | Status: DC

## 2022-03-13 MED ORDER — OXYCODONE HCL 5 MG PO TABS: 5.0000 mg | ORAL_TABLET | ORAL | Status: DC | PRN

## 2022-03-13 MED ORDER — SIMETHICONE 80 MG PO CHEW: 80.0000 mg | CHEWABLE_TABLET | ORAL | Status: DC | PRN

## 2022-03-13 MED ORDER — ONDANSETRON HCL 4 MG PO TABS: 4.0000 mg | ORAL_TABLET | ORAL | Status: DC | PRN

## 2022-03-13 MED ORDER — COCONUT OIL OIL: 1.0000 "application " | TOPICAL_OIL | Status: DC | PRN

## 2022-03-13 MED ORDER — WITCH HAZEL-GLYCERIN EX PADS: 1.0000 "application " | MEDICATED_PAD | CUTANEOUS | Status: DC | PRN

## 2022-03-13 MED ORDER — BISACODYL 10 MG RE SUPP: 10.0000 mg | Freq: Every day | RECTAL | Status: DC | PRN

## 2022-03-13 MED ORDER — ACETAMINOPHEN 500 MG PO TABS
1000.0000 mg | ORAL_TABLET | Freq: Four times a day (QID) | ORAL | Status: DC
  Administered 2022-03-13 – 2022-03-14 (×4): 1000 mg via ORAL
  Filled 2022-03-13 (×4): qty 2

## 2022-03-13 MED ORDER — ZOLPIDEM TARTRATE 5 MG PO TABS: 5.0000 mg | ORAL_TABLET | Freq: Every evening | ORAL | Status: DC | PRN

## 2022-03-13 MED ORDER — IBUPROFEN 600 MG PO TABS
600.0000 mg | ORAL_TABLET | Freq: Four times a day (QID) | ORAL | Status: DC
  Administered 2022-03-13 – 2022-03-14 (×5): 600 mg via ORAL
  Filled 2022-03-13 (×5): qty 1

## 2022-03-13 MED ORDER — ONDANSETRON HCL 4 MG/2ML IJ SOLN: 4.0000 mg | INTRAMUSCULAR | Status: DC | PRN

## 2022-03-13 MED ORDER — TETANUS-DIPHTH-ACELL PERTUSSIS 5-2.5-18.5 LF-MCG/0.5 IM SUSY: 0.5000 mL | PREFILLED_SYRINGE | Freq: Once | INTRAMUSCULAR | Status: DC

## 2022-03-13 MED ORDER — SENNOSIDES-DOCUSATE SODIUM 8.6-50 MG PO TABS
2.0000 | ORAL_TABLET | ORAL | Status: DC
  Administered 2022-03-13: 2 via ORAL
  Filled 2022-03-13: qty 2

## 2022-03-13 MED ORDER — TERBUTALINE SULFATE 1 MG/ML IJ SOLN: 0.2500 mg | Freq: Once | INTRAMUSCULAR | Status: DC | PRN

## 2022-03-13 MED ORDER — PENICILLIN G POT IN DEXTROSE 60000 UNIT/ML IV SOLN: 3.0000 10*6.[IU] | INTRAVENOUS | Status: DC

## 2022-03-13 MED ORDER — FLEET ENEMA 7-19 GM/118ML RE ENEM: 1.0000 | ENEMA | Freq: Every day | RECTAL | Status: DC | PRN

## 2022-03-13 NOTE — Progress Notes (Signed)
Notified CNM Ivonne Andrew and reported Pt was very light headed-dizzy when up in the bathroom. Vs 119/73 HR 63. RR 16, 97.3temp.  FF 2 fingers below Umbilicus. Perineal swelling has not changed since admission to m/b unit. Pt will hydrate and have some food. No additional orders received.

## 2022-03-13 NOTE — H&P (Signed)
OB ADMISSION/ HISTORY & PHYSICAL:  Admission Date: 03/13/2022 10:51 AM  Admit Diagnosis: Encounter for induction of labor [Z34.90]    Kristina Aguilar is a 31 y.o. female presenting for IOL at term. S/P membrane sweep in office 5/16, cvx was 3-4/80/-1. Desires VBAC#3 and unmedicated birth. Spouse Alex present and supportive. Expecting a baby girl with undecided name.   Prenatal History: [id number]   EDC : 03/07/2022, by [facility]  Prenatal care at [facility] since 13 wks, primary Renae Fickle, CNM   Prenatal course complicated by: Hx G1 C/S for breech, VBAC x 2 Hx hydrocephalus with shunt placement in childhood, cleared by neurology for vaginal birth in previous pregnancies GBS positive  Prenatal Labs: ABO, Rh: O positive (09/20 0000)  Antibody: negative (05/18 1205) Rubella: Immune (11/07 0000)  RPR: Nonreactive (11/07 0000)  HBsAg: Negative (11/07 0000)  HIV: Non-reactive (11/07 0000)  GBS: Positive/-- (04/11 0000)  1 hr Glucola : 139 Genetic Screening: declined Ultrasound: normal XX anatomy, anterior placenta R pyelectasis 5mm, resolved at 32 wks Growth 34 wks 75%tile (5'10") ANFT BPP 40.4 wks 8/8 AFI 25.7 mild poly Pelvis proven 8'2"  Vaccines: declined all  Medical / Surgical History :  Past medical history:  Past Medical History:  Diagnosis Date   Anemia    hx 2011   GERD (gastroesophageal reflux disease)    Hydrocephalus with operating shunt [facility])    surgery 2001 and then revised 2002, Neurologist Dr Alvester Morin at [facility]   Postpartum care following cesarean delivery (8/28) 06/23/2014   Postpartum care following vbac delivery (9/18) 07/14/2016   Scoliosis    VBAC, delivered, current hospitalization 07/14/2016     Past surgical history:  Past Surgical History:  Procedure Laterality Date   CESAREAN SECTION N/A 06/23/2014   Procedure: Primary CESAREAN SECTION;  Surgeon: Robley Fries, MD;  Location: [facility];  Service: Obstetrics;  Laterality:  N/A;  EDD: 06/27/14   CESAREAN SECTION CLASSICAL  2015   COLONOSCOPY     VENTRICULOPERITONEAL SHUNT     09/2000 and revised in 03/2001   WISDOM TOOTH EXTRACTION  2007     Family History:  Family History  Problem Relation Age of Onset   Asthma Mother    Hypertension Mother    Heart disease Mother    Hyperlipidemia Mother    Hypertension Father    Hyperlipidemia Father    Pseudotumor cerebri Sister      Social History:  reports that she has never smoked. She has never used smokeless tobacco. She reports that she does not drink alcohol and does not use drugs.  Allergies: Patient has no known allergies.   Current Medications at time of admission:  Medications Prior to Admission  Medication Sig Dispense Refill Last Dose   calcium carbonate (TUMS - DOSED IN MG ELEMENTAL CALCIUM) 500 MG chewable tablet Chew 1 tablet by mouth daily as needed for indigestion or heartburn.      ibuprofen (ADVIL,MOTRIN) 600 MG tablet Take 1 tablet (600 mg total) by mouth every 6 (six) hours as needed for moderate pain. 30 tablet 0    Prenatal Vit-Fe Fumarate-FA (PRENATAL MULTIVITAMIN) TABS tablet Take 1 tablet by mouth daily at 12 noon.         Review of Systems: ROS All negative. + FM  Physical Exam: Vital signs and nursing notes reviewed.  Patient Vitals for the past 24 hrs:  BP Temp Temp src Pulse Resp SpO2 Height Weight  03/13/22 1129 121/78 98.1  F (36.7 C) Oral 90 18 98 % -- --  03/13/22 1126 -- -- -- -- -- -- 5\' 11"  (1.803 m) 78.9 kg     General: AAO x 3, NAD Heart: RRR Lungs:CTAB Abdomen: Gravid, NT, Leopold's vertex, fetal spine to maternal L Extremities: no edema Genitalia / VE: Dilation: 4 Effacement (%): 90 Station: -1 Exam by:: [email or web address], CNM  AROM with patient consent, controlled fluid release, copious amounts, vertex well applied to cervix. Clear fluid.  FHR: 120 BPM, moderate variability, + accels, no decels TOCO: Ctx occasional, mild  Labs:   Pending T&S, CBC, RPR   Recent Labs    03/13/22 1205  WBC 10.3  HGB 11.5*  HCT 34.6*  PLT 218     Assessment/Plan:  31 y.o. G4P3003 at [redacted]w[redacted]d Hx C/S x 1, VBAC x 2, for TOLAC / IOL at term  Hx shunt placement for hydrocephalus in childhood, neuro cleared for vaginal birth Fetal wellbeing - FHT category 1 EFW 8 lbs, AGA  Labor: Admit to L&D, routine orders - IOL with AROM, will use Pitocin PRN if no physiologic labor in next few hours - declines PIV and continuous EFM after counseling  GBS positive, desires risk-based treatment, aware of required observation of newborn x 48 hrs Rubella immune Rh positive  Pain control: desires unmedicated birth Analgesia/anesthesia PRN  Anticipated MOD: VBAC  Plans to breastfeed. POC discussed with patient and support team, all questions answered.  Dr [redacted]w[redacted]d notified of admission / plan of care   Conni Elliot CNM, MSN 03/13/2022, 12:40 PM

## 2022-03-14 DIAGNOSIS — B951 Streptococcus, group B, as the cause of diseases classified elsewhere: Secondary | ICD-10-CM | POA: Diagnosis present

## 2022-03-14 DIAGNOSIS — O9902 Anemia complicating childbirth: Secondary | ICD-10-CM | POA: Diagnosis not present

## 2022-03-14 LAB — CBC
HCT: 27.2 % — ABNORMAL LOW (ref 36.0–46.0)
Hemoglobin: 9.1 g/dL — ABNORMAL LOW (ref 12.0–15.0)
MCH: 28.3 pg (ref 26.0–34.0)
MCHC: 33.5 g/dL (ref 30.0–36.0)
MCV: 84.7 fL (ref 80.0–100.0)
Platelets: 181 10*3/uL (ref 150–400)
RBC: 3.21 MIL/uL — ABNORMAL LOW (ref 3.87–5.11)
RDW: 16 % — ABNORMAL HIGH (ref 11.5–15.5)
WBC: 11.8 10*3/uL — ABNORMAL HIGH (ref 4.0–10.5)
nRBC: 0 % (ref 0.0–0.2)

## 2022-03-14 MED ORDER — POLYSACCHARIDE IRON COMPLEX 150 MG PO CAPS
150.0000 mg | ORAL_CAPSULE | Freq: Every day | ORAL | Status: DC
  Administered 2022-03-14: 150 mg via ORAL
  Filled 2022-03-14: qty 1

## 2022-03-14 MED ORDER — MAGNESIUM OXIDE -MG SUPPLEMENT 400 (240 MG) MG PO TABS
400.0000 mg | ORAL_TABLET | Freq: Every day | ORAL | Status: DC
  Administered 2022-03-14: 400 mg via ORAL
  Filled 2022-03-14: qty 1

## 2022-03-14 NOTE — Discharge Summary (Signed)
OB Discharge Summary  Patient Name: Kristina Aguilar DOB: 12/20/1990 MRN: [id number]  Date of admission: 03/13/2022 Delivering provider: Neta Mends   Admitting diagnosis: Encounter for induction of labor [Z34.90] Intrauterine pregnancy: [redacted]w[redacted]d     Secondary diagnosis: Patient Active Problem List   Diagnosis Date Noted   Positive GBS test 03/14/2022   Maternal anemia, with delivery 03/14/2022   Encounter for induction of labor 03/13/2022   Obstetrical laceration - 1st degree vaginal 03/13/2022   VBAC, delivered 11/18/2018   Postpartum care following vbac delivery 5/18 07/14/2016    Date of discharge: 03/14/2022   Discharge diagnosis: Principal Problem:   Postpartum care following vbac delivery 5/18 Active Problems:   VBAC, delivered   Encounter for induction of labor   Obstetrical laceration - 1st degree vaginal   Positive GBS test   Maternal anemia, with delivery                                                             Post partum procedures: None  Augmentation: AROM Pain control: None  Laceration:1st degree;Vaginal  Complications: None  Hospital course:  Induction of Labor With Vaginal Delivery   31 y.o. yo [id number] at [redacted]w[redacted]d was admitted to the hospital 03/13/2022 for induction of labor.  Indication for induction: Postdates.  Patient had an uncomplicated labor course as follows: Membrane Rupture Time/Date: 11:11 AM ,03/13/2022   Delivery Method:VBAC, Spontaneous  Episiotomy: None  Lacerations:  1st degree;Vaginal  Details of delivery can be found in separate delivery note.  Patient had a routine postpartum course. Patient is discharged home 03/14/22.  Newborn Data: Birth date:03/13/2022  Birth time:4:10 PM  Gender:Female  Living status:Living  Apgars:9 ,9  Weight:4030 g   Physical exam  Vitals:   03/13/22 2314 03/14/22 0325 03/14/22 0845 03/14/22 1518  BP: 109/70 113/62 108/71 103/66  Pulse: 61 64 64 (!) 57  Resp: 18 18 18 20   Temp: 98 F (36.7 C)  98.1 F (36.7 C)  97.7 F (36.5 C)  TempSrc: Oral Oral Oral Oral  SpO2: 98% 99% 100% 99%  Weight:      Height:       General: alert and cooperative Lochia: appropriate Uterine Fundus: firm Incision: N/A Perineum: repair intact DVT Evaluation: No evidence of DVT seen on physical exam.  Labs: Lab Results  Component Value Date   WBC 11.8 (H) 03/14/2022   HGB 9.1 (L) 03/14/2022   HCT 27.2 (L) 03/14/2022   MCV 84.7 03/14/2022   PLT 181 03/14/2022      03/14/2022    3:18 PM 11/19/2018    3:17 AM  Edinburgh Postnatal Depression Scale Screening Tool  I have been able to laugh and see the funny side of things. 0 0  I have looked forward with enjoyment to things. 0 0  I have blamed myself unnecessarily when things went wrong. 0 0  I have been anxious or worried for no good reason. 0 0  I have felt scared or panicky for no good reason. 0 0  Things have been getting on top of me. 0 0  I have been so unhappy that I have had difficulty sleeping. 0 0  I have felt sad or miserable. 0 0  I have been so unhappy that I have been crying. 0  0  The thought of harming myself has occurred to me. 0 0  Edinburgh Postnatal Depression Scale Total 0 0   Discharge instructions:  per After Visit Summary  After Visit Meds:  Allergies as of 03/14/2022   No Known Allergies      Medication List     TAKE these medications    calcium carbonate 500 MG chewable tablet Commonly known as: TUMS - dosed in mg elemental calcium Chew 1 tablet by mouth daily as needed for indigestion or heartburn.   prenatal multivitamin Tabs tablet Take 1 tablet by mouth daily at 12 noon.       Activity: Advance as tolerated. Pelvic rest for 6 weeks.   Newborn Data: Live born female  Birth Weight: 8 lb 14.2 oz (4030 g) APGAR: 9, 9  Newborn Delivery   Birth date/time: 03/13/2022 16:10:00 Delivery type: VBAC, Spontaneous     Named Iris Baby Feeding: Breast Disposition:home with mother  Delivery  Report:  Review the Delivery Report for details.    Follow up:  Follow-up Information     Neta Mends, CNM. Schedule an appointment as soon as possible for a visit in 6 week(s).   Specialty: Obstetrics and Gynecology Contact information: [address] [place] [address] [phone]                Clancy Gourd, MSN 03/14/2022, 3:23 PM

## 2022-03-14 NOTE — Lactation Note (Addendum)
This note was copied from a baby's chart. Lactation Consultation Note Mom's 4th child. Mom BF her other 3 for about 1 yr each. 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 Mom states having no trouble BF this baby. Mom latched baby to breast when LC came into room. Baby gulping at the breast. Mom had good latch and body alignment. Mom encouraged to feed baby 8-12 times/24 hours and with feeding cues.   Newborn feeding habits, STS, I&O reviewed. Encouraged mom to call for assistance or questions. Mom in agreement to be PRN if needs anything mom will call.  Patient Name: Kristina Aguilar QASTM'H Date: 03/14/2022 Reason for consult: Initial assessment;Term Age:68 hours  Maternal Data Has patient been taught Hand Expression?: No Does the patient have breastfeeding experience prior to this delivery?: Yes How long did the patient breastfeed?: 1 yr each to her other 3 children  Feeding    LATCH Score Latch: Grasps breast easily, tongue down, lips flanged, rhythmical sucking.  Audible Swallowing: Spontaneous and intermittent  Type of Nipple: Everted at rest and after stimulation  Comfort (Breast/Nipple): Filling, red/small blisters or bruises, mild/mod discomfort (a little sore)  Hold (Positioning): No assistance needed to correctly position infant at breast.  LATCH Score: 9   Lactation Tools Discussed/Used    Interventions Interventions: Breast feeding basics reviewed;LC Services brochure  Discharge    Consult Status Consult Status: PRN Date: 03/15/22 Follow-up type: In-patient    Charyl Dancer 03/14/2022, 2:57 AM

## 2022-03-14 NOTE — Progress Notes (Addendum)
PPD#1 S/P VBAC  Live born female  Birth Weight: 8 lb 14.2 oz (4030 g) APGAR: 9, 9  Newborn Delivery   Birth date/time: 03/13/2022 16:10:00 Delivery type: VBAC, Spontaneous     Baby name: Iris  Delivering provider: Juliene Pina   Lacerations:1st degree;Vaginal   Feeding: breast  Pain control at delivery: None   S:  Reports feeling well with no complaints. Husband at the bedside.              Tolerating PO/No nausea or vomiting             Bleeding is light             Pain controlled with acetaminophen and ibuprofen (OTC)             Up ad lib/ambulatory/voiding without difficulties   O:  A & O x 3, in no apparent distress   Vitals:   03/13/22 1805 03/13/22 1851 03/13/22 2314 03/14/22 0325  BP: 123/70 119/78 109/70 113/62  Pulse: 75 63 61 64  Resp: 16 16 18 18   Temp: 98.4 F (36.9 C) (!) 97.3 F (36.3 C) 98 F (36.7 C) 98.1 F (36.7 C)  TempSrc: Oral Oral Oral Oral  SpO2:   98% 99%  Weight:      Height:       Recent Labs    03/13/22 1205 03/14/22 0506  WBC 10.3 11.8*  HGB 11.5* 9.1*  HCT 34.6* 27.2*  PLT 218 181    Blood type: --/--/O POS (05/18 1205)  Rubella: Immune (11/07 0000)   I&O: I/O last 3 completed shifts: In: -  Out: 300 [Blood:300]          No intake/output data recorded.  Gen: AAO x 3, NAD Abdomen: soft, non-tender, non-distended Fundus: firm, non-tender, U-1   Perineum: repair intact   Lochia: small   Extremities: no edema, no calf pain or tenderness   A/P:  PPD # 1 31 y.o., [id number]  Principal Problem:   Postpartum care following vbac delivery 5/18  Doing well - stable status  Routine post partum orders Active Problems:   VBAC, delivered   Encounter for induction of labor   Obstetrical laceration - 1st degree vaginal  Discussed perineal care and comfort measures.    Positive GBS test  Declined IV antibiotics  Baby likely to need 48 hours of observation prior to discharge   Maternal anemia, with delivery  Hgb 9.1 this  AM  Asymptomatic  Start PO Niferex and mag oxide  Anticipate discharge tomorrow.   Suzan Nailer, MSN, CNM 03/14/2022, 8:19 AM

## 2022-03-22 ENCOUNTER — Telehealth (HOSPITAL_COMMUNITY): Payer: Self-pay

## 2022-03-22 NOTE — Telephone Encounter (Signed)
No answer. Left message to return nurse call.  Sharyn Lull [facility] 05/27//2023,1056

## 2022-03-26 DIAGNOSIS — M9901 Segmental and somatic dysfunction of cervical region: Secondary | ICD-10-CM | POA: Diagnosis not present

## 2022-03-26 DIAGNOSIS — M4124 Other idiopathic scoliosis, thoracic region: Secondary | ICD-10-CM | POA: Diagnosis not present

## 2022-03-26 DIAGNOSIS — M9903 Segmental and somatic dysfunction of lumbar region: Secondary | ICD-10-CM | POA: Diagnosis not present

## 2022-03-26 DIAGNOSIS — M9902 Segmental and somatic dysfunction of thoracic region: Secondary | ICD-10-CM | POA: Diagnosis not present

## 2022-04-14 DIAGNOSIS — M9902 Segmental and somatic dysfunction of thoracic region: Secondary | ICD-10-CM | POA: Diagnosis not present

## 2022-04-14 DIAGNOSIS — M9903 Segmental and somatic dysfunction of lumbar region: Secondary | ICD-10-CM | POA: Diagnosis not present

## 2022-04-14 DIAGNOSIS — M9901 Segmental and somatic dysfunction of cervical region: Secondary | ICD-10-CM | POA: Diagnosis not present

## 2022-04-14 DIAGNOSIS — M4124 Other idiopathic scoliosis, thoracic region: Secondary | ICD-10-CM | POA: Diagnosis not present

## 2022-05-12 DIAGNOSIS — M9901 Segmental and somatic dysfunction of cervical region: Secondary | ICD-10-CM | POA: Diagnosis not present

## 2022-05-12 DIAGNOSIS — M9903 Segmental and somatic dysfunction of lumbar region: Secondary | ICD-10-CM | POA: Diagnosis not present

## 2022-05-12 DIAGNOSIS — M9902 Segmental and somatic dysfunction of thoracic region: Secondary | ICD-10-CM | POA: Diagnosis not present

## 2022-05-12 DIAGNOSIS — M4124 Other idiopathic scoliosis, thoracic region: Secondary | ICD-10-CM | POA: Diagnosis not present

## 2022-05-26 DIAGNOSIS — M9902 Segmental and somatic dysfunction of thoracic region: Secondary | ICD-10-CM | POA: Diagnosis not present

## 2022-05-26 DIAGNOSIS — M9901 Segmental and somatic dysfunction of cervical region: Secondary | ICD-10-CM | POA: Diagnosis not present

## 2022-05-26 DIAGNOSIS — M4124 Other idiopathic scoliosis, thoracic region: Secondary | ICD-10-CM | POA: Diagnosis not present

## 2022-05-26 DIAGNOSIS — M9903 Segmental and somatic dysfunction of lumbar region: Secondary | ICD-10-CM | POA: Diagnosis not present

## 2022-06-03 DIAGNOSIS — M9902 Segmental and somatic dysfunction of thoracic region: Secondary | ICD-10-CM | POA: Diagnosis not present

## 2022-06-03 DIAGNOSIS — M9901 Segmental and somatic dysfunction of cervical region: Secondary | ICD-10-CM | POA: Diagnosis not present

## 2022-06-03 DIAGNOSIS — M4124 Other idiopathic scoliosis, thoracic region: Secondary | ICD-10-CM | POA: Diagnosis not present

## 2022-06-03 DIAGNOSIS — M9903 Segmental and somatic dysfunction of lumbar region: Secondary | ICD-10-CM | POA: Diagnosis not present

## 2022-06-09 DIAGNOSIS — M4124 Other idiopathic scoliosis, thoracic region: Secondary | ICD-10-CM | POA: Diagnosis not present

## 2022-06-09 DIAGNOSIS — M9901 Segmental and somatic dysfunction of cervical region: Secondary | ICD-10-CM | POA: Diagnosis not present

## 2022-06-09 DIAGNOSIS — M9903 Segmental and somatic dysfunction of lumbar region: Secondary | ICD-10-CM | POA: Diagnosis not present

## 2022-06-09 DIAGNOSIS — M9902 Segmental and somatic dysfunction of thoracic region: Secondary | ICD-10-CM | POA: Diagnosis not present

## 2022-06-26 DIAGNOSIS — M9903 Segmental and somatic dysfunction of lumbar region: Secondary | ICD-10-CM | POA: Diagnosis not present

## 2022-06-26 DIAGNOSIS — M9902 Segmental and somatic dysfunction of thoracic region: Secondary | ICD-10-CM | POA: Diagnosis not present

## 2022-06-26 DIAGNOSIS — M4124 Other idiopathic scoliosis, thoracic region: Secondary | ICD-10-CM | POA: Diagnosis not present

## 2022-06-26 DIAGNOSIS — M9901 Segmental and somatic dysfunction of cervical region: Secondary | ICD-10-CM | POA: Diagnosis not present

## 2022-07-02 DIAGNOSIS — Z6820 Body mass index (BMI) 20.0-20.9, adult: Secondary | ICD-10-CM | POA: Diagnosis not present

## 2022-07-02 DIAGNOSIS — Z0142 Encounter for cervical smear to confirm findings of recent normal smear following initial abnormal smear: Secondary | ICD-10-CM | POA: Diagnosis not present

## 2022-07-02 DIAGNOSIS — Z124 Encounter for screening for malignant neoplasm of cervix: Secondary | ICD-10-CM | POA: Diagnosis not present

## 2022-07-02 DIAGNOSIS — Z01419 Encounter for gynecological examination (general) (routine) without abnormal findings: Secondary | ICD-10-CM | POA: Diagnosis not present

## 2022-07-02 DIAGNOSIS — Z1329 Encounter for screening for other suspected endocrine disorder: Secondary | ICD-10-CM | POA: Diagnosis not present

## 2022-07-02 DIAGNOSIS — Z01411 Encounter for gynecological examination (general) (routine) with abnormal findings: Secondary | ICD-10-CM | POA: Diagnosis not present

## 2022-07-07 DIAGNOSIS — M4124 Other idiopathic scoliosis, thoracic region: Secondary | ICD-10-CM | POA: Diagnosis not present

## 2022-07-07 DIAGNOSIS — M9903 Segmental and somatic dysfunction of lumbar region: Secondary | ICD-10-CM | POA: Diagnosis not present

## 2022-07-07 DIAGNOSIS — M9902 Segmental and somatic dysfunction of thoracic region: Secondary | ICD-10-CM | POA: Diagnosis not present

## 2022-07-07 DIAGNOSIS — M9901 Segmental and somatic dysfunction of cervical region: Secondary | ICD-10-CM | POA: Diagnosis not present

## 2022-07-21 DIAGNOSIS — M9901 Segmental and somatic dysfunction of cervical region: Secondary | ICD-10-CM | POA: Diagnosis not present

## 2022-07-21 DIAGNOSIS — M4124 Other idiopathic scoliosis, thoracic region: Secondary | ICD-10-CM | POA: Diagnosis not present

## 2022-07-21 DIAGNOSIS — M9902 Segmental and somatic dysfunction of thoracic region: Secondary | ICD-10-CM | POA: Diagnosis not present

## 2022-07-21 DIAGNOSIS — M9903 Segmental and somatic dysfunction of lumbar region: Secondary | ICD-10-CM | POA: Diagnosis not present

## 2022-08-04 DIAGNOSIS — M9901 Segmental and somatic dysfunction of cervical region: Secondary | ICD-10-CM | POA: Diagnosis not present

## 2022-08-04 DIAGNOSIS — M9902 Segmental and somatic dysfunction of thoracic region: Secondary | ICD-10-CM | POA: Diagnosis not present

## 2022-08-04 DIAGNOSIS — M4124 Other idiopathic scoliosis, thoracic region: Secondary | ICD-10-CM | POA: Diagnosis not present

## 2022-08-04 DIAGNOSIS — M9903 Segmental and somatic dysfunction of lumbar region: Secondary | ICD-10-CM | POA: Diagnosis not present

## 2022-08-21 DIAGNOSIS — M9903 Segmental and somatic dysfunction of lumbar region: Secondary | ICD-10-CM | POA: Diagnosis not present

## 2022-08-21 DIAGNOSIS — M9902 Segmental and somatic dysfunction of thoracic region: Secondary | ICD-10-CM | POA: Diagnosis not present

## 2022-08-21 DIAGNOSIS — M4124 Other idiopathic scoliosis, thoracic region: Secondary | ICD-10-CM | POA: Diagnosis not present

## 2022-08-21 DIAGNOSIS — M9901 Segmental and somatic dysfunction of cervical region: Secondary | ICD-10-CM | POA: Diagnosis not present

## 2022-09-01 DIAGNOSIS — M4124 Other idiopathic scoliosis, thoracic region: Secondary | ICD-10-CM | POA: Diagnosis not present

## 2022-09-01 DIAGNOSIS — M9902 Segmental and somatic dysfunction of thoracic region: Secondary | ICD-10-CM | POA: Diagnosis not present

## 2022-09-01 DIAGNOSIS — M9903 Segmental and somatic dysfunction of lumbar region: Secondary | ICD-10-CM | POA: Diagnosis not present

## 2022-09-01 DIAGNOSIS — M9901 Segmental and somatic dysfunction of cervical region: Secondary | ICD-10-CM | POA: Diagnosis not present

## 2022-10-28 DIAGNOSIS — M9902 Segmental and somatic dysfunction of thoracic region: Secondary | ICD-10-CM | POA: Diagnosis not present

## 2022-10-28 DIAGNOSIS — M9901 Segmental and somatic dysfunction of cervical region: Secondary | ICD-10-CM | POA: Diagnosis not present

## 2022-10-28 DIAGNOSIS — M4124 Other idiopathic scoliosis, thoracic region: Secondary | ICD-10-CM | POA: Diagnosis not present

## 2022-10-28 DIAGNOSIS — M9903 Segmental and somatic dysfunction of lumbar region: Secondary | ICD-10-CM | POA: Diagnosis not present

## 2022-11-10 DIAGNOSIS — M4124 Other idiopathic scoliosis, thoracic region: Secondary | ICD-10-CM | POA: Diagnosis not present

## 2022-11-10 DIAGNOSIS — M9903 Segmental and somatic dysfunction of lumbar region: Secondary | ICD-10-CM | POA: Diagnosis not present

## 2022-11-10 DIAGNOSIS — M9902 Segmental and somatic dysfunction of thoracic region: Secondary | ICD-10-CM | POA: Diagnosis not present

## 2022-11-10 DIAGNOSIS — M9901 Segmental and somatic dysfunction of cervical region: Secondary | ICD-10-CM | POA: Diagnosis not present

## 2022-11-24 DIAGNOSIS — M9901 Segmental and somatic dysfunction of cervical region: Secondary | ICD-10-CM | POA: Diagnosis not present

## 2022-11-24 DIAGNOSIS — M9902 Segmental and somatic dysfunction of thoracic region: Secondary | ICD-10-CM | POA: Diagnosis not present

## 2022-11-24 DIAGNOSIS — M4124 Other idiopathic scoliosis, thoracic region: Secondary | ICD-10-CM | POA: Diagnosis not present

## 2022-11-24 DIAGNOSIS — M9903 Segmental and somatic dysfunction of lumbar region: Secondary | ICD-10-CM | POA: Diagnosis not present

## 2022-12-08 DIAGNOSIS — M4124 Other idiopathic scoliosis, thoracic region: Secondary | ICD-10-CM | POA: Diagnosis not present

## 2022-12-08 DIAGNOSIS — M9902 Segmental and somatic dysfunction of thoracic region: Secondary | ICD-10-CM | POA: Diagnosis not present

## 2022-12-08 DIAGNOSIS — M9901 Segmental and somatic dysfunction of cervical region: Secondary | ICD-10-CM | POA: Diagnosis not present

## 2022-12-08 DIAGNOSIS — M9903 Segmental and somatic dysfunction of lumbar region: Secondary | ICD-10-CM | POA: Diagnosis not present

## 2022-12-22 DIAGNOSIS — M9901 Segmental and somatic dysfunction of cervical region: Secondary | ICD-10-CM | POA: Diagnosis not present

## 2022-12-22 DIAGNOSIS — M4124 Other idiopathic scoliosis, thoracic region: Secondary | ICD-10-CM | POA: Diagnosis not present

## 2022-12-22 DIAGNOSIS — M9902 Segmental and somatic dysfunction of thoracic region: Secondary | ICD-10-CM | POA: Diagnosis not present

## 2022-12-22 DIAGNOSIS — M9903 Segmental and somatic dysfunction of lumbar region: Secondary | ICD-10-CM | POA: Diagnosis not present

## 2023-01-05 DIAGNOSIS — M4124 Other idiopathic scoliosis, thoracic region: Secondary | ICD-10-CM | POA: Diagnosis not present

## 2023-01-05 DIAGNOSIS — M9901 Segmental and somatic dysfunction of cervical region: Secondary | ICD-10-CM | POA: Diagnosis not present

## 2023-01-05 DIAGNOSIS — M9903 Segmental and somatic dysfunction of lumbar region: Secondary | ICD-10-CM | POA: Diagnosis not present

## 2023-01-05 DIAGNOSIS — M9902 Segmental and somatic dysfunction of thoracic region: Secondary | ICD-10-CM | POA: Diagnosis not present

## 2023-01-19 DIAGNOSIS — M9903 Segmental and somatic dysfunction of lumbar region: Secondary | ICD-10-CM | POA: Diagnosis not present

## 2023-01-19 DIAGNOSIS — M4124 Other idiopathic scoliosis, thoracic region: Secondary | ICD-10-CM | POA: Diagnosis not present

## 2023-01-19 DIAGNOSIS — M9902 Segmental and somatic dysfunction of thoracic region: Secondary | ICD-10-CM | POA: Diagnosis not present

## 2023-01-19 DIAGNOSIS — M9901 Segmental and somatic dysfunction of cervical region: Secondary | ICD-10-CM | POA: Diagnosis not present

## 2023-02-02 DIAGNOSIS — M9902 Segmental and somatic dysfunction of thoracic region: Secondary | ICD-10-CM | POA: Diagnosis not present

## 2023-02-02 DIAGNOSIS — M9901 Segmental and somatic dysfunction of cervical region: Secondary | ICD-10-CM | POA: Diagnosis not present

## 2023-02-02 DIAGNOSIS — M4124 Other idiopathic scoliosis, thoracic region: Secondary | ICD-10-CM | POA: Diagnosis not present

## 2023-02-02 DIAGNOSIS — M9903 Segmental and somatic dysfunction of lumbar region: Secondary | ICD-10-CM | POA: Diagnosis not present

## 2023-02-24 DIAGNOSIS — M9902 Segmental and somatic dysfunction of thoracic region: Secondary | ICD-10-CM | POA: Diagnosis not present

## 2023-02-24 DIAGNOSIS — M4124 Other idiopathic scoliosis, thoracic region: Secondary | ICD-10-CM | POA: Diagnosis not present

## 2023-02-24 DIAGNOSIS — M9903 Segmental and somatic dysfunction of lumbar region: Secondary | ICD-10-CM | POA: Diagnosis not present

## 2023-02-24 DIAGNOSIS — M9901 Segmental and somatic dysfunction of cervical region: Secondary | ICD-10-CM | POA: Diagnosis not present

## 2023-03-09 DIAGNOSIS — M9902 Segmental and somatic dysfunction of thoracic region: Secondary | ICD-10-CM | POA: Diagnosis not present

## 2023-03-09 DIAGNOSIS — M9901 Segmental and somatic dysfunction of cervical region: Secondary | ICD-10-CM | POA: Diagnosis not present

## 2023-03-09 DIAGNOSIS — M4124 Other idiopathic scoliosis, thoracic region: Secondary | ICD-10-CM | POA: Diagnosis not present

## 2023-03-09 DIAGNOSIS — M9903 Segmental and somatic dysfunction of lumbar region: Secondary | ICD-10-CM | POA: Diagnosis not present

## 2023-03-24 DIAGNOSIS — M9902 Segmental and somatic dysfunction of thoracic region: Secondary | ICD-10-CM | POA: Diagnosis not present

## 2023-03-24 DIAGNOSIS — M4124 Other idiopathic scoliosis, thoracic region: Secondary | ICD-10-CM | POA: Diagnosis not present

## 2023-03-24 DIAGNOSIS — M9903 Segmental and somatic dysfunction of lumbar region: Secondary | ICD-10-CM | POA: Diagnosis not present

## 2023-03-24 DIAGNOSIS — M9901 Segmental and somatic dysfunction of cervical region: Secondary | ICD-10-CM | POA: Diagnosis not present

## 2023-04-02 DIAGNOSIS — M9903 Segmental and somatic dysfunction of lumbar region: Secondary | ICD-10-CM | POA: Diagnosis not present

## 2023-04-02 DIAGNOSIS — M9902 Segmental and somatic dysfunction of thoracic region: Secondary | ICD-10-CM | POA: Diagnosis not present

## 2023-04-02 DIAGNOSIS — M9901 Segmental and somatic dysfunction of cervical region: Secondary | ICD-10-CM | POA: Diagnosis not present

## 2023-04-02 DIAGNOSIS — M4124 Other idiopathic scoliosis, thoracic region: Secondary | ICD-10-CM | POA: Diagnosis not present

## 2023-04-14 DIAGNOSIS — M9902 Segmental and somatic dysfunction of thoracic region: Secondary | ICD-10-CM | POA: Diagnosis not present

## 2023-04-14 DIAGNOSIS — M9901 Segmental and somatic dysfunction of cervical region: Secondary | ICD-10-CM | POA: Diagnosis not present

## 2023-04-14 DIAGNOSIS — M4124 Other idiopathic scoliosis, thoracic region: Secondary | ICD-10-CM | POA: Diagnosis not present

## 2023-04-14 DIAGNOSIS — M9903 Segmental and somatic dysfunction of lumbar region: Secondary | ICD-10-CM | POA: Diagnosis not present

## 2023-04-27 DIAGNOSIS — M4124 Other idiopathic scoliosis, thoracic region: Secondary | ICD-10-CM | POA: Diagnosis not present

## 2023-04-27 DIAGNOSIS — M9901 Segmental and somatic dysfunction of cervical region: Secondary | ICD-10-CM | POA: Diagnosis not present

## 2023-04-27 DIAGNOSIS — M9902 Segmental and somatic dysfunction of thoracic region: Secondary | ICD-10-CM | POA: Diagnosis not present

## 2023-04-27 DIAGNOSIS — M9903 Segmental and somatic dysfunction of lumbar region: Secondary | ICD-10-CM | POA: Diagnosis not present

## 2023-05-11 DIAGNOSIS — M9902 Segmental and somatic dysfunction of thoracic region: Secondary | ICD-10-CM | POA: Diagnosis not present

## 2023-05-11 DIAGNOSIS — M4124 Other idiopathic scoliosis, thoracic region: Secondary | ICD-10-CM | POA: Diagnosis not present

## 2023-05-11 DIAGNOSIS — M9903 Segmental and somatic dysfunction of lumbar region: Secondary | ICD-10-CM | POA: Diagnosis not present

## 2023-05-11 DIAGNOSIS — M9901 Segmental and somatic dysfunction of cervical region: Secondary | ICD-10-CM | POA: Diagnosis not present

## 2023-05-18 DIAGNOSIS — M9903 Segmental and somatic dysfunction of lumbar region: Secondary | ICD-10-CM | POA: Diagnosis not present

## 2023-05-18 DIAGNOSIS — M9901 Segmental and somatic dysfunction of cervical region: Secondary | ICD-10-CM | POA: Diagnosis not present

## 2023-05-18 DIAGNOSIS — M9902 Segmental and somatic dysfunction of thoracic region: Secondary | ICD-10-CM | POA: Diagnosis not present

## 2023-05-18 DIAGNOSIS — M4124 Other idiopathic scoliosis, thoracic region: Secondary | ICD-10-CM | POA: Diagnosis not present

## 2023-06-01 DIAGNOSIS — M9901 Segmental and somatic dysfunction of cervical region: Secondary | ICD-10-CM | POA: Diagnosis not present

## 2023-06-01 DIAGNOSIS — M9903 Segmental and somatic dysfunction of lumbar region: Secondary | ICD-10-CM | POA: Diagnosis not present

## 2023-06-01 DIAGNOSIS — M9902 Segmental and somatic dysfunction of thoracic region: Secondary | ICD-10-CM | POA: Diagnosis not present

## 2023-06-01 DIAGNOSIS — M4124 Other idiopathic scoliosis, thoracic region: Secondary | ICD-10-CM | POA: Diagnosis not present

## 2023-06-15 DIAGNOSIS — M9902 Segmental and somatic dysfunction of thoracic region: Secondary | ICD-10-CM | POA: Diagnosis not present

## 2023-06-15 DIAGNOSIS — M9901 Segmental and somatic dysfunction of cervical region: Secondary | ICD-10-CM | POA: Diagnosis not present

## 2023-06-15 DIAGNOSIS — M4124 Other idiopathic scoliosis, thoracic region: Secondary | ICD-10-CM | POA: Diagnosis not present

## 2023-06-15 DIAGNOSIS — M9903 Segmental and somatic dysfunction of lumbar region: Secondary | ICD-10-CM | POA: Diagnosis not present

## 2023-07-06 DIAGNOSIS — M9901 Segmental and somatic dysfunction of cervical region: Secondary | ICD-10-CM | POA: Diagnosis not present

## 2023-07-06 DIAGNOSIS — M9902 Segmental and somatic dysfunction of thoracic region: Secondary | ICD-10-CM | POA: Diagnosis not present

## 2023-07-06 DIAGNOSIS — M4124 Other idiopathic scoliosis, thoracic region: Secondary | ICD-10-CM | POA: Diagnosis not present

## 2023-07-06 DIAGNOSIS — M9903 Segmental and somatic dysfunction of lumbar region: Secondary | ICD-10-CM | POA: Diagnosis not present

## 2023-07-29 DIAGNOSIS — Z3689 Encounter for other specified antenatal screening: Secondary | ICD-10-CM | POA: Diagnosis not present

## 2023-07-29 DIAGNOSIS — J020 Streptococcal pharyngitis: Secondary | ICD-10-CM | POA: Diagnosis not present

## 2023-07-29 DIAGNOSIS — Z3200 Encounter for pregnancy test, result unknown: Secondary | ICD-10-CM | POA: Diagnosis not present

## 2023-08-03 DIAGNOSIS — M4124 Other idiopathic scoliosis, thoracic region: Secondary | ICD-10-CM | POA: Diagnosis not present

## 2023-08-03 DIAGNOSIS — M9902 Segmental and somatic dysfunction of thoracic region: Secondary | ICD-10-CM | POA: Diagnosis not present

## 2023-08-03 DIAGNOSIS — M9901 Segmental and somatic dysfunction of cervical region: Secondary | ICD-10-CM | POA: Diagnosis not present

## 2023-08-03 DIAGNOSIS — M9903 Segmental and somatic dysfunction of lumbar region: Secondary | ICD-10-CM | POA: Diagnosis not present

## 2023-08-05 DIAGNOSIS — E039 Hypothyroidism, unspecified: Secondary | ICD-10-CM | POA: Diagnosis not present

## 2023-08-05 DIAGNOSIS — E611 Iron deficiency: Secondary | ICD-10-CM | POA: Diagnosis not present

## 2023-08-05 DIAGNOSIS — E559 Vitamin D deficiency, unspecified: Secondary | ICD-10-CM | POA: Diagnosis not present

## 2023-08-05 DIAGNOSIS — K5900 Constipation, unspecified: Secondary | ICD-10-CM | POA: Diagnosis not present

## 2023-08-05 DIAGNOSIS — R5383 Other fatigue: Secondary | ICD-10-CM | POA: Diagnosis not present

## 2023-08-05 DIAGNOSIS — M2550 Pain in unspecified joint: Secondary | ICD-10-CM | POA: Diagnosis not present

## 2023-08-05 DIAGNOSIS — D519 Vitamin B12 deficiency anemia, unspecified: Secondary | ICD-10-CM | POA: Diagnosis not present

## 2023-08-05 DIAGNOSIS — L659 Nonscarring hair loss, unspecified: Secondary | ICD-10-CM | POA: Diagnosis not present

## 2023-08-17 DIAGNOSIS — R5383 Other fatigue: Secondary | ICD-10-CM | POA: Diagnosis not present

## 2023-08-17 DIAGNOSIS — E039 Hypothyroidism, unspecified: Secondary | ICD-10-CM | POA: Diagnosis not present

## 2023-08-17 DIAGNOSIS — M2550 Pain in unspecified joint: Secondary | ICD-10-CM | POA: Diagnosis not present

## 2023-08-17 DIAGNOSIS — L659 Nonscarring hair loss, unspecified: Secondary | ICD-10-CM | POA: Diagnosis not present

## 2023-08-25 DIAGNOSIS — Z3201 Encounter for pregnancy test, result positive: Secondary | ICD-10-CM | POA: Diagnosis not present

## 2023-09-15 DIAGNOSIS — Z118 Encounter for screening for other infectious and parasitic diseases: Secondary | ICD-10-CM | POA: Diagnosis not present

## 2023-09-15 DIAGNOSIS — Z131 Encounter for screening for diabetes mellitus: Secondary | ICD-10-CM | POA: Diagnosis not present

## 2023-09-15 DIAGNOSIS — Z3481 Encounter for supervision of other normal pregnancy, first trimester: Secondary | ICD-10-CM | POA: Diagnosis not present

## 2023-09-15 DIAGNOSIS — Z3689 Encounter for other specified antenatal screening: Secondary | ICD-10-CM | POA: Diagnosis not present

## 2023-09-15 LAB — OB RESULTS CONSOLE HIV ANTIBODY (ROUTINE TESTING): HIV: NONREACTIVE

## 2023-09-15 LAB — OB RESULTS CONSOLE GC/CHLAMYDIA
Chlamydia: NEGATIVE
Neisseria Gonorrhea: NEGATIVE

## 2023-09-15 LAB — HEPATITIS C ANTIBODY: HCV Ab: NEGATIVE

## 2023-09-15 LAB — OB RESULTS CONSOLE RUBELLA ANTIBODY, IGM: Rubella: IMMUNE

## 2023-09-15 LAB — OB RESULTS CONSOLE HEPATITIS B SURFACE ANTIGEN: Hepatitis B Surface Ag: NEGATIVE

## 2023-10-28 NOTE — L&D Delivery Note (Signed)
   Delivery Note:   [id number] at [redacted]w[redacted]d  Admitting diagnosis: Normal labor [O80, Z37.9] Risks: GBS positive Onset of labor: 04/06/24 1700 IOL/Augmentation: N/A ROM: Clear fluid.   Complete dilation at   0330 Onset of pushing at 0345 FHR second stage cEFM.  Analgesia/Anesthesia intrapartum:None Pushing in a sitting position on the toilet with CNM and L&D staff support, and husband Fabio Holts present for birth and supportive.  Delivery of a Live born female  Birth Weight:  Pending.  APGAR: 7, 9  Newborn Delivery   Birth date/time: 04/07/2024 04:06:00 Delivery type: VBAC, Spontaneous     In cephalic presentation, position LOA. Nuchal and cord around body x1. Loose and reduced as infant brought to maternal abdomen.   APGAR:1 min-7 , 5 min-9   Nuchal and body Cord: Yes  Cord double clamped after cessation of pulsation, cut by Father, Alex.  Collection of cord blood for typing completed. Arterial cord blood sample-No   Placenta delivered-Spontaneous with 3 vessels. Uterotonics: none. Placenta to L&D.  Uterine tone firm.   Bleeding small.  1st degree laceration identified.  Episiotomy:None Local analgesia: Lidocaine .   Repair: 3-0 in the usual fashion.  Est. Blood Loss (mL):247.00  Complications: None  Mom to postpartum.  Baby girl to Couplet care / Skin to Skin.  Delivery Report:  Review the Delivery Report for details.    Launa Police, CNM, MSN 04/07/2024, 4:42 AM

## 2023-11-04 DIAGNOSIS — Z3A18 18 weeks gestation of pregnancy: Secondary | ICD-10-CM | POA: Diagnosis not present

## 2023-11-04 DIAGNOSIS — O3503X0 Maternal care for (suspected) central nervous system malformation or damage in fetus, choroid plexus cysts, not applicable or unspecified: Secondary | ICD-10-CM | POA: Diagnosis not present

## 2023-11-17 DIAGNOSIS — Z362 Encounter for other antenatal screening follow-up: Secondary | ICD-10-CM | POA: Diagnosis not present

## 2023-12-07 DIAGNOSIS — M2550 Pain in unspecified joint: Secondary | ICD-10-CM | POA: Diagnosis not present

## 2023-12-07 DIAGNOSIS — D519 Vitamin B12 deficiency anemia, unspecified: Secondary | ICD-10-CM | POA: Diagnosis not present

## 2023-12-07 DIAGNOSIS — E039 Hypothyroidism, unspecified: Secondary | ICD-10-CM | POA: Diagnosis not present

## 2023-12-07 DIAGNOSIS — L659 Nonscarring hair loss, unspecified: Secondary | ICD-10-CM | POA: Diagnosis not present

## 2023-12-07 DIAGNOSIS — T781XXA Other adverse food reactions, not elsewhere classified, initial encounter: Secondary | ICD-10-CM | POA: Diagnosis not present

## 2023-12-07 DIAGNOSIS — R5383 Other fatigue: Secondary | ICD-10-CM | POA: Diagnosis not present

## 2023-12-07 DIAGNOSIS — E559 Vitamin D deficiency, unspecified: Secondary | ICD-10-CM | POA: Diagnosis not present

## 2023-12-07 DIAGNOSIS — E611 Iron deficiency: Secondary | ICD-10-CM | POA: Diagnosis not present

## 2023-12-19 DIAGNOSIS — J020 Streptococcal pharyngitis: Secondary | ICD-10-CM | POA: Diagnosis not present

## 2023-12-23 DIAGNOSIS — R5383 Other fatigue: Secondary | ICD-10-CM | POA: Diagnosis not present

## 2023-12-23 DIAGNOSIS — M2550 Pain in unspecified joint: Secondary | ICD-10-CM | POA: Diagnosis not present

## 2023-12-23 DIAGNOSIS — L659 Nonscarring hair loss, unspecified: Secondary | ICD-10-CM | POA: Diagnosis not present

## 2023-12-23 DIAGNOSIS — E039 Hypothyroidism, unspecified: Secondary | ICD-10-CM | POA: Diagnosis not present

## 2024-01-13 DIAGNOSIS — Z3689 Encounter for other specified antenatal screening: Secondary | ICD-10-CM | POA: Diagnosis not present

## 2024-01-13 LAB — OB RESULTS CONSOLE RPR: RPR: NONREACTIVE

## 2024-03-03 DIAGNOSIS — Z3685 Encounter for antenatal screening for Streptococcus B: Secondary | ICD-10-CM | POA: Diagnosis not present

## 2024-03-03 LAB — OB RESULTS CONSOLE GBS: GBS: POSITIVE

## 2024-04-06 DIAGNOSIS — Z3A40 40 weeks gestation of pregnancy: Secondary | ICD-10-CM | POA: Diagnosis not present

## 2024-04-06 DIAGNOSIS — O480 Post-term pregnancy: Secondary | ICD-10-CM | POA: Diagnosis not present

## 2024-04-07 ENCOUNTER — Encounter (HOSPITAL_COMMUNITY): Payer: Self-pay

## 2024-04-07 ENCOUNTER — Other Ambulatory Visit: Payer: Self-pay

## 2024-04-07 ENCOUNTER — Inpatient Hospital Stay (HOSPITAL_COMMUNITY)
Admission: AD | Admit: 2024-04-07 | Discharge: 2024-04-08 | DRG: 807 | Disposition: A | Discharge status: Home / Self Care | Attending: Obstetrics & Gynecology | Admitting: Obstetrics & Gynecology

## 2024-04-07 DIAGNOSIS — Z982 Presence of cerebrospinal fluid drainage device: Secondary | ICD-10-CM

## 2024-04-07 DIAGNOSIS — O99824 Streptococcus B carrier state complicating childbirth: Secondary | ICD-10-CM | POA: Diagnosis present

## 2024-04-07 DIAGNOSIS — O34219 Maternal care for unspecified type scar from previous cesarean delivery: Secondary | ICD-10-CM | POA: Diagnosis not present

## 2024-04-07 DIAGNOSIS — O34211 Maternal care for low transverse scar from previous cesarean delivery: Secondary | ICD-10-CM | POA: Diagnosis not present

## 2024-04-07 DIAGNOSIS — O480 Post-term pregnancy: Secondary | ICD-10-CM | POA: Diagnosis not present

## 2024-04-07 DIAGNOSIS — Z3A41 41 weeks gestation of pregnancy: Secondary | ICD-10-CM

## 2024-04-07 DIAGNOSIS — Z8249 Family history of ischemic heart disease and other diseases of the circulatory system: Secondary | ICD-10-CM | POA: Diagnosis not present

## 2024-04-07 DIAGNOSIS — O26893 Other specified pregnancy related conditions, third trimester: Secondary | ICD-10-CM | POA: Diagnosis not present

## 2024-04-07 DIAGNOSIS — B951 Streptococcus, group B, as the cause of diseases classified elsewhere: Secondary | ICD-10-CM | POA: Diagnosis present

## 2024-04-07 LAB — CBC
HCT: 32.3 % — ABNORMAL LOW (ref 36.0–46.0)
Hemoglobin: 11 g/dL — ABNORMAL LOW (ref 12.0–15.0)
MCH: 29.6 pg (ref 26.0–34.0)
MCHC: 34.1 g/dL (ref 30.0–36.0)
MCV: 87.1 fL (ref 80.0–100.0)
Platelets: 206 10*3/uL (ref 150–400)
RBC: 3.71 MIL/uL — ABNORMAL LOW (ref 3.87–5.11)
RDW: 15.4 % (ref 11.5–15.5)
WBC: 16.5 10*3/uL — ABNORMAL HIGH (ref 4.0–10.5)
nRBC: 0 % (ref 0.0–0.2)

## 2024-04-07 LAB — TYPE AND SCREEN
ABO/RH(D): O POS
Antibody Screen: NEGATIVE

## 2024-04-07 LAB — RPR: RPR Ser Ql: NONREACTIVE

## 2024-04-07 MED ORDER — LACTATED RINGERS IV SOLN: 500.0000 mL | INTRAVENOUS | Status: DC | PRN

## 2024-04-07 MED ORDER — IBUPROFEN 600 MG PO TABS
600.0000 mg | ORAL_TABLET | Freq: Four times a day (QID) | ORAL | Status: DC
Start: 2024-04-07 — End: 2024-04-08
  Administered 2024-04-07: 600 mg via ORAL
  Filled 2024-04-07 (×2): qty 1

## 2024-04-07 MED ORDER — ONDANSETRON HCL 4 MG/2ML IJ SOLN: 4.0000 mg | INTRAMUSCULAR | Status: DC | PRN

## 2024-04-07 MED ORDER — ONDANSETRON HCL 4 MG/2ML IJ SOLN: 4.0000 mg | Freq: Four times a day (QID) | INTRAMUSCULAR | Status: DC | PRN

## 2024-04-07 MED ORDER — TETANUS-DIPHTH-ACELL PERTUSSIS 5-2.5-18.5 LF-MCG/0.5 IM SUSY: 0.5000 mL | PREFILLED_SYRINGE | Freq: Once | INTRAMUSCULAR | Status: DC

## 2024-04-07 MED ORDER — SENNOSIDES-DOCUSATE SODIUM 8.6-50 MG PO TABS: 2.0000 | ORAL_TABLET | Freq: Every day | ORAL | Status: DC

## 2024-04-07 MED ORDER — ONDANSETRON HCL 4 MG PO TABS
4.0000 mg | ORAL_TABLET | ORAL | Status: DC | PRN
Start: 2024-04-07 — End: 2024-04-08

## 2024-04-07 MED ORDER — COCONUT OIL OIL: 1.0000 | TOPICAL_OIL | Status: DC | PRN

## 2024-04-07 MED ORDER — SIMETHICONE 80 MG PO CHEW: 80.0000 mg | CHEWABLE_TABLET | ORAL | Status: DC | PRN

## 2024-04-07 MED ORDER — DIBUCAINE (PERIANAL) 1 % EX OINT: 1.0000 | TOPICAL_OINTMENT | CUTANEOUS | Status: DC | PRN

## 2024-04-07 MED ORDER — WITCH HAZEL-GLYCERIN EX PADS: 1.0000 | MEDICATED_PAD | CUTANEOUS | Status: DC | PRN

## 2024-04-07 MED ORDER — ACETAMINOPHEN 325 MG PO TABS: 650.0000 mg | ORAL_TABLET | ORAL | Status: DC | PRN

## 2024-04-07 MED ORDER — LIDOCAINE HCL (PF) 1 % IJ SOLN
30.0000 mL | INTRAMUSCULAR | Status: AC | PRN
  Administered 2024-04-07: 30 mL via SUBCUTANEOUS
  Filled 2024-04-07: qty 30

## 2024-04-07 MED ORDER — OXYTOCIN-SODIUM CHLORIDE 30-0.9 UT/500ML-% IV SOLN: 2.5000 [IU]/h | INTRAVENOUS | Status: DC

## 2024-04-07 MED ORDER — PRENATAL MULTIVITAMIN CH
1.0000 | ORAL_TABLET | Freq: Every day | ORAL | Status: DC
Start: 2024-04-07 — End: 2024-04-08
  Filled 2024-04-07: qty 1

## 2024-04-07 MED ORDER — ZOLPIDEM TARTRATE 5 MG PO TABS: 5.0000 mg | ORAL_TABLET | Freq: Every evening | ORAL | Status: DC | PRN

## 2024-04-07 MED ORDER — OXYTOCIN BOLUS FROM INFUSION: 333.0000 mL | Freq: Once | INTRAVENOUS | Status: DC

## 2024-04-07 MED ORDER — BENZOCAINE-MENTHOL 20-0.5 % EX AERO: 1.0000 | INHALATION_SPRAY | CUTANEOUS | Status: DC | PRN

## 2024-04-07 MED ORDER — DIPHENHYDRAMINE HCL 25 MG PO CAPS: 25.0000 mg | ORAL_CAPSULE | Freq: Four times a day (QID) | ORAL | Status: DC | PRN

## 2024-04-07 MED ORDER — SOD CITRATE-CITRIC ACID 500-334 MG/5ML PO SOLN: 30.0000 mL | ORAL | Status: DC | PRN

## 2024-04-07 MED ORDER — LACTATED RINGERS IV SOLN: INTRAVENOUS | Status: DC

## 2024-04-07 MED ORDER — ACETAMINOPHEN 325 MG PO TABS
650.0000 mg | ORAL_TABLET | ORAL | Status: DC | PRN
Start: 2024-04-07 — End: 2024-04-07

## 2024-04-07 MED ORDER — OXYCODONE-ACETAMINOPHEN 5-325 MG PO TABS: 2.0000 | ORAL_TABLET | ORAL | Status: DC | PRN

## 2024-04-07 NOTE — Lactation Note (Signed)
 This note was copied from a baby's chart. Lactation Consultation Note  Patient Name: Kristina Aguilar [id number] Date: 04/07/2024 Age:34 hours Reason for consult: Initial assessment;Term  P5- MOB reports that infant nurses well so far. This is her 5th child and she has breast fed all of her children for 1-1.5 years. MOB would like for the [facility] team to assess infant's latch for the next feeding to ensure infant is getting deep enough. Infant last ate 30 minutes ago and was asleep, so LC encouraged MOB to call out when she is ready for assistance. MOB denied having any questions or concerns at this time. MOB only has a manual pump for at home. LC offered to provide her with  DEBP through her insurance, but she declined. LC encouraged her to call and ask for a referral to be sent out for a STORK if she changes her mind.   LC reviewed the first 24 hr birthday nap, day 2 cluster feeding, feeding infant on cue 8-12x in 24 hrs, not allowing infant to go over 3 hrs without a feeding, CDC milk storage guidelines, LC services handout and engorgement/breast care. LC encouraged MOB to call for further assistance as needed.  Maternal Data Has patient been taught Hand Expression?: Yes Does the patient have breastfeeding experience prior to this delivery?: Yes How long did the patient breastfeed?: 1-1.5 years per child  Feeding Mother's Current Feeding Choice: Breast Milk  Lactation Tools Discussed/Used Pump Education: Milk Storage  Interventions Interventions: Breast feeding basics reviewed;Education;LC Services brochure  Discharge Discharge Education: Engorgement and breast care;Warning signs for feeding baby Pump: Manual;Personal;Declined  Consult Status Consult Status: Follow-up Date: 04/08/24 Follow-up type: In-patient    Vernette Goo BS, IBCLC 04/07/2024, 6:42 PM

## 2024-04-07 NOTE — Plan of Care (Signed)

## 2024-04-07 NOTE — Progress Notes (Signed)
 Patient up to bathroom with NT.  Patient did not have epidural and was able to ambulate.  Patient was able to void and then started feeling dizzy and lightheaded.  NT called for assistance by RN.  Charge RN went to room with another RN to find patient very dizzy and lightheaded on toilet with NT and FOB's support.  RN instructed NT and other RN to grab the [place] and an ammonia stick.  Patient stated she was having a hard time hearing RN, and felt like she was going to pass out.  [place] and ammonia stick brought to room right as patient passed out while sitting on the toilet.  Patient was supported by FOB and Charge RN until patient was awakened by the ammonia stick.  Patient was safely transferred back to bed via [facility].  Fudal massage firm, 3 below umbilicus, bleeding WNL.  Vital signs taken and MD called and notified.  Encouraged patient to order breakfast and to call for assistance for next trip to the bathroom with the [facility] just as a precaution.

## 2024-04-07 NOTE — H&P (Signed)
 OB ADMISSION/ HISTORY & PHYSICAL:  Admission Date: 04/07/2024  2:29 AM  Admit Diagnosis: Normal labor [O80, Z37.9]    Kristina Aguilar is a 33 y.o. female G5P4004 at [redacted]w[redacted]d presenting for consistent contraction pattern overnight with noticeable intensity over the past 1-2 hours. She denies VB or LOF at this time.Her pregnancy course remains complicated by grand multiparus birth, h/o cesarean with G1, TOLACx3,scoliosis of spine, history of chicken pox and personal h/o hydrocephalus with shunt. She is supported by her husband Kristina Aguilar today and expectedly awaiting baby Kristina Aguilar.  Prenatal History: [id number]   EDC: 03/31/2024, by Other Basis  Prenatal care at [facility] since 1st trimester.  Primary: Amanda/Midwives  Prenatal course complicated by: grand multiparus birth, h/o cesarean with G1, TOLACx3,scoliosis of spine, history of chicken pox and personal h/o hydrocephalus with shunt.   Prenatal Labs: ABO, Rh:   O positive Antibody: negative Rubella:   immune RPR:   non-reactive HBsAg:   negative HIV:   negative GBS:   positive, abx declined at this time unless indicated with maternal or fetal indications of infection 1 hr Glucola : 93mg /dl.  Genetic Screening: Declined.  Ultrasound: 1st trimester and CUS w/ normal anatomy.     Maternal Diabetes: No Genetic Screening: Declined Maternal Ultrasounds/Referrals: Normal Fetal Ultrasounds or other Referrals:  None Maternal Substance Abuse:  No Significant Maternal Medications:  None Significant Maternal Lab Results:  Group B Strep positive Other Comments:  None  Medical / Surgical History : Past medical history:  Past Medical History:  Diagnosis Date   Anemia    hx 2011   GERD (gastroesophageal reflux disease)    Hydrocephalus with operating shunt [facility])    surgery 2001 and then revised 2002, Neurologist Dr Parke Boll at [facility]   Postpartum care following cesarean delivery (8/28) 06/23/2014   Postpartum care following vbac  delivery (9/18) 07/14/2016   Scoliosis    VBAC, delivered, current hospitalization 07/14/2016    Past surgical history:  Past Surgical History:  Procedure Laterality Date   CESAREAN SECTION N/A 06/23/2014   Procedure: Primary CESAREAN SECTION;  Surgeon: Shasta Deist, MD;  Location: [facility];  Service: Obstetrics;  Laterality: N/A;  EDD: 06/27/14   CESAREAN SECTION CLASSICAL  2015   COLONOSCOPY     VENTRICULOPERITONEAL SHUNT     09/2000 and revised in 03/2001   WISDOM TOOTH EXTRACTION  2007    Family History:  Family History  Problem Relation Age of Onset   Asthma Mother    Hypertension Mother    Heart disease Mother    Hyperlipidemia Mother    Hypertension Father    Hyperlipidemia Father    Pseudotumor cerebri Sister     Social History:  reports that she has never smoked. She has never used smokeless tobacco. She reports that she does not drink alcohol and does not use drugs.  Allergies: Patient has no known allergies.   Current Medications at time of admission:  Medications Prior to Admission  Medication Sig Dispense Refill Last Dose/Taking   Prenatal Vit-Fe Fumarate-FA (PRENATAL MULTIVITAMIN) TABS tablet Take 1 tablet by mouth daily at 12 noon.    04/06/2024   calcium carbonate (TUMS - DOSED IN MG ELEMENTAL CALCIUM) 500 MG chewable tablet Chew 1 tablet by mouth daily as needed for indigestion or heartburn.        Review of Systems: ROS  Physical Exam: Vital signs and nursing notes reviewed.  Patient Vitals for the past 24 hrs:  BP Temp Temp src Pulse  Resp SpO2  04/07/24 0248 126/73 (!) 96.7 F (35.9 C) Axillary 80 18 100 %     General: AAO x 3, NAD Heart: RRR Lungs:CTAB Abdomen: Gravid, NT Extremities: negative for edema. SVE: Dilation: 8.5 Effacement (%): 80 Station: -1 Presentation: Vertex Exam by:: Mackenzie, CNM   FHR: 140BPM, moderate variability, 15/15 accels, no decels overall, occasional variables noted. TOCO: Contractions   Labs:   No results for  input(s): WBC, HGB, HCT, PLT in the last 72 hours.  Assessment/Plan: 33 y.o. [id number] at [redacted]w[redacted]d  Fetal wellbeing - FHT category 1 EFW  Labor: Active labor, membranes intact at this time.  Reviewed admission to L&D. cEFM in place d/t TOLAC status. Discussed R/B/A to vaginal delivery. Pt consented to admission, AROM if and when indicated and GBS treatment only if maternal or fetal infection suspected. GBS precautions given, pt and spouse decline abx at this time.   SVE: 8/80/-1.  Pain control: desires unmedicated delivery at this time.  Analgesia/anesthesia PRN.  Anticipated MOD: NSVB.  Plans to breastfeed, no circumcision if female.  POC discussed with patient and support team, all questions answered.  Dr Derril Flint notified of admission/plan of care.  Launa Police CNM, MSN 04/07/2024, 3:06 AM

## 2024-04-07 NOTE — MAU Note (Signed)
 Kristina Aguilar is a 33 y.o. at [redacted]w[redacted]d here in MAU reporting: ctx since 2300 - every few minutes. Reports bloody show. Denies LOF. +FM   LMP: NA  Onset of complaint: 2300 Pain score: 4 Vitals:   04/07/24 0248  BP: 126/73  Pulse: 80  Resp: 18  Temp: (!) 96.7 F (35.9 C)  SpO2: 100%     FHT: 130  Lab orders placed from triage: labor eval

## 2024-04-08 NOTE — Discharge Summary (Signed)
 OB Discharge Summary  Patient Name: Kristina Aguilar DOB: [birth year] MRN: [id number]  Date of admission: 04/07/2024 Delivering provider: Launa Police  Admitting diagnosis: Normal labor [O80, Z37.9] Intrauterine pregnancy: [redacted]w[redacted]d     Secondary diagnosis: Patient Active Problem List   Diagnosis Date Noted   Normal labor 04/07/2024   Positive GBS test 03/14/2022   Obstetrical laceration - 1st degree vaginal 03/13/2022   VBAC, delivered 11/18/2018   Postpartum care following vbac delivery 6/12 07/14/2016    Date of discharge: 04/08/2024   Discharge diagnosis: Principal Problem:   Postpartum care following vbac delivery 6/12 Active Problems:   VBAC, delivered   Obstetrical laceration - 1st degree vaginal   Positive GBS test   Normal labor                                                            Augmentation: AROM Pain control: None Laceration:1st degree Complications: None  Hospital course:  Onset of Labor With Vaginal Delivery      33 y.o. yo G5P5005 at [redacted]w[redacted]d was admitted in Active Labor on 04/07/2024.  Membrane Rupture Time/Date: 3:30 AM,04/07/2024  Delivery Method:VBAC, Spontaneous Operative Delivery:N/A Episiotomy: None Lacerations:  1st degree Patient had an uncomplicated postpartum course. She is ambulating, tolerating a regular diet, passing flatus, and urinating well. Patient is discharged home in stable condition on 04/08/24.  Newborn Data: Birth date:04/07/2024 Birth time:4:06 AM Gender:Female Living status:Living Apgars:7 ,9  Weight:3820 g  Physical exam  Vitals:   04/07/24 1200 04/07/24 1600 04/07/24 1611 04/07/24 2029  BP: 123/77 118/73  108/72  Pulse: 67 66  76  Resp: 18 18  18   Temp: 98.7 F (37.1 C) 98.4 F (36.9 C)  98.4 F (36.9 C)  TempSrc: Oral Oral  Oral  SpO2: 100% 100%  100%  Weight:      Height:   5' 11 (1.803 m)    General: alert and cooperative Lochia: appropriate Uterine Fundus: firm Perineum: repair intact, no  edema DVT Evaluation: No evidence of DVT seen on physical exam.  Labs: Lab Results  Component Value Date   WBC 16.5 (H) 04/07/2024   HGB 11.0 (L) 04/07/2024   HCT 32.3 (L) 04/07/2024   MCV 87.1 04/07/2024   PLT 206 04/07/2024      04/07/2024   12:00 PM 03/14/2022    3:18 PM 11/19/2018    3:17 AM  Edinburgh Postnatal Depression Scale Screening Tool  I have been able to laugh and see the funny side of things. 0 0 0  I have looked forward with enjoyment to things. 0 0 0  I have blamed myself unnecessarily when things went wrong. 0 0 0  I have been anxious or worried for no good reason. 0 0 0  I have felt scared or panicky for no good reason. 0 0 0  Things have been getting on top of me. 0 0 0  I have been so unhappy that I have had difficulty sleeping. 0 0 0  I have felt sad or miserable. 0 0 0  I have been so unhappy that I have been crying. 0 0 0  The thought of harming myself has occurred to me. 0 0 0  Edinburgh Postnatal Depression Scale Total 0 0  0  Data saved with a previous flowsheet row definition   Discharge instructions:  per After Visit Summary  After Visit Meds:  Allergies as of 04/08/2024   No Known Allergies      Medication List     STOP taking these medications    calcium carbonate 500 MG chewable tablet Commonly known as: TUMS - dosed in mg elemental calcium   prenatal multivitamin Tabs tablet       Activity: Advance as tolerated. Pelvic rest for 6 weeks.   Newborn Data: Live born female  Birth Weight: 8 lb 6.8 oz (3820 g) APGAR: 7, 9  Newborn Delivery   Birth date/time: 04/07/2024 04:06:00 Delivery type: VBAC, Spontaneous    Baby Feeding: Breast Disposition:home with mother  Delivery Report:  Review the Delivery Report for details.    Follow up:  Follow-up Information     Joel Murphy, CNM Follow up.   Specialty: Certified Nurse Midwife Contact information: [address] [place] [address] [phone]                 Joel Murphy, CNM, MSN 04/08/2024, 3:41 AM

## 2024-04-08 NOTE — Plan of Care (Signed)
  Problem: Education: Goal: Knowledge of General Education information will improve Description: Including pain rating scale, medication(s)/side effects and non-pharmacologic comfort measures Outcome: Adequate for Discharge   Problem: Health Behavior/Discharge Planning: Goal: Ability to manage health-related needs will improve Outcome: Adequate for Discharge   Problem: Clinical Measurements: Goal: Ability to maintain clinical measurements within normal limits will improve Outcome: Adequate for Discharge Goal: Will remain free from infection Outcome: Adequate for Discharge Goal: Diagnostic test results will improve Outcome: Adequate for Discharge Goal: Respiratory complications will improve Outcome: Adequate for Discharge Goal: Cardiovascular complication will be avoided Outcome: Adequate for Discharge   Problem: Activity: Goal: Risk for activity intolerance will decrease Outcome: Adequate for Discharge   Problem: Nutrition: Goal: Adequate nutrition will be maintained Outcome: Adequate for Discharge   Problem: Coping: Goal: Level of anxiety will decrease Outcome: Adequate for Discharge   Problem: Elimination: Goal: Will not experience complications related to bowel motility Outcome: Adequate for Discharge Goal: Will not experience complications related to urinary retention Outcome: Adequate for Discharge   Problem: Pain Managment: Goal: General experience of comfort will improve and/or be controlled Outcome: Adequate for Discharge   Problem: Safety: Goal: Ability to remain free from injury will improve Outcome: Adequate for Discharge   Problem: Skin Integrity: Goal: Risk for impaired skin integrity will decrease Outcome: Adequate for Discharge   Problem: Education: Goal: Knowledge of Childbirth will improve Outcome: Adequate for Discharge Goal: Ability to make informed decisions regarding treatment and plan of care will improve Outcome: Adequate for  Discharge Goal: Ability to state and carry out methods to decrease the pain will improve Outcome: Adequate for Discharge Goal: Individualized Educational Video(s) Outcome: Adequate for Discharge   Problem: Coping: Goal: Ability to verbalize concerns and feelings about labor and delivery will improve Outcome: Adequate for Discharge   Problem: Life Cycle: Goal: Ability to make normal progression through stages of labor will improve Outcome: Adequate for Discharge Goal: Ability to effectively push during vaginal delivery will improve Outcome: Adequate for Discharge   Problem: Role Relationship: Goal: Will demonstrate positive interactions with the child Outcome: Adequate for Discharge   Problem: Safety: Goal: Risk of complications during labor and delivery will decrease Outcome: Adequate for Discharge   Problem: Pain Management: Goal: Relief or control of pain from uterine contractions will improve Outcome: Adequate for Discharge   Problem: Education: Goal: Knowledge of condition will improve Outcome: Adequate for Discharge Goal: Individualized Educational Video(s) Outcome: Adequate for Discharge Goal: Individualized Newborn Educational Video(s) Outcome: Adequate for Discharge   Problem: Activity: Goal: Will verbalize the importance of balancing activity with adequate rest periods Outcome: Adequate for Discharge Goal: Ability to tolerate increased activity will improve Outcome: Adequate for Discharge   Problem: Coping: Goal: Ability to identify and utilize available resources and services will improve Outcome: Adequate for Discharge   Problem: Life Cycle: Goal: Chance of risk for complications during the postpartum period will decrease Outcome: Adequate for Discharge   Problem: Role Relationship: Goal: Ability to demonstrate positive interaction with newborn will improve Outcome: Adequate for Discharge   Problem: Skin Integrity: Goal: Demonstration of wound healing  without infection will improve Outcome: Adequate for Discharge

## 2024-04-08 NOTE — Discharge Instructions (Signed)

## 2024-04-09 ENCOUNTER — Inpatient Hospital Stay (HOSPITAL_COMMUNITY): Admission: RE | Admit: 2024-04-09 | Source: Home / Self Care

## 2024-04-09 ENCOUNTER — Inpatient Hospital Stay (HOSPITAL_COMMUNITY)

## 2024-04-15 ENCOUNTER — Telehealth (HOSPITAL_COMMUNITY): Payer: Self-pay | Admitting: *Deleted

## 2024-04-15 NOTE — Telephone Encounter (Signed)
 04/15/2024  Name: Kristina Aguilar MRN: [id number] DOB: 10-03-1991  Reason for Call:  Transition of Care Hospital Discharge Call  Contact Status: Patient Contact Status: Message  Language assistant needed:          Follow-Up Questions:    Dimple Francis Postnatal Depression Scale:  In the Past 7 Days:    PHQ2-9 Depression Scale:     Discharge Follow-up:    Post-discharge interventions: NA  Pearlie Bougie, RN 04/15/2024 12:34
